# Patient Record
Sex: Male | Born: 1965 | Race: White | Hispanic: No | Smoking: Current every day smoker
Health system: Southern US, Community
[De-identification: ages and names within clinical notes are randomized; demographics above are authoritative.]

## PROBLEM LIST (undated history)

## (undated) DIAGNOSIS — C439 Malignant melanoma of skin, unspecified: Secondary | ICD-10-CM

## (undated) DIAGNOSIS — K589 Irritable bowel syndrome without diarrhea: Secondary | ICD-10-CM

## (undated) DIAGNOSIS — D649 Anemia, unspecified: Secondary | ICD-10-CM

## (undated) DIAGNOSIS — K219 Gastro-esophageal reflux disease without esophagitis: Secondary | ICD-10-CM

## (undated) DIAGNOSIS — F419 Anxiety disorder, unspecified: Secondary | ICD-10-CM

## (undated) DIAGNOSIS — E785 Hyperlipidemia, unspecified: Secondary | ICD-10-CM

## (undated) HISTORY — DX: Anemia, unspecified: D64.9

## (undated) HISTORY — PX: FRACTURE SURGERY: SHX138

## (undated) HISTORY — DX: Gastro-esophageal reflux disease without esophagitis: K21.9

## (undated) HISTORY — DX: Anxiety disorder, unspecified: F41.9

## (undated) HISTORY — DX: Hyperlipidemia, unspecified: E78.5

## (undated) HISTORY — DX: Irritable bowel syndrome, unspecified: K58.9

## (undated) HISTORY — PX: HERNIA REPAIR: SHX51

## (undated) HISTORY — DX: Malignant melanoma of skin, unspecified: C43.9

---

## 2004-02-24 ENCOUNTER — Ambulatory Visit: Payer: Self-pay | Admitting: Internal Medicine

## 2004-11-25 ENCOUNTER — Ambulatory Visit: Payer: Self-pay | Admitting: Internal Medicine

## 2005-03-04 ENCOUNTER — Ambulatory Visit: Payer: Self-pay | Admitting: Internal Medicine

## 2005-03-31 ENCOUNTER — Ambulatory Visit: Payer: Self-pay | Admitting: Internal Medicine

## 2005-05-11 ENCOUNTER — Ambulatory Visit: Payer: Self-pay | Admitting: Internal Medicine

## 2005-05-16 ENCOUNTER — Emergency Department (HOSPITAL_COMMUNITY): Admission: EM | Admit: 2005-05-16 | Discharge: 2005-05-16 | Payer: Self-pay | Admitting: Emergency Medicine

## 2005-08-08 ENCOUNTER — Ambulatory Visit: Payer: Self-pay | Admitting: Internal Medicine

## 2005-09-12 ENCOUNTER — Ambulatory Visit: Payer: Self-pay | Admitting: Internal Medicine

## 2006-03-09 ENCOUNTER — Ambulatory Visit: Payer: Self-pay | Admitting: Internal Medicine

## 2006-05-02 ENCOUNTER — Ambulatory Visit: Payer: Self-pay | Admitting: Internal Medicine

## 2006-07-07 ENCOUNTER — Encounter: Admission: RE | Admit: 2006-07-07 | Discharge: 2006-07-07 | Payer: Self-pay | Admitting: Internal Medicine

## 2006-08-10 DIAGNOSIS — K219 Gastro-esophageal reflux disease without esophagitis: Secondary | ICD-10-CM

## 2006-08-13 ENCOUNTER — Emergency Department (HOSPITAL_COMMUNITY): Admission: EM | Admit: 2006-08-13 | Discharge: 2006-08-14 | Payer: Self-pay | Admitting: Emergency Medicine

## 2006-08-17 ENCOUNTER — Ambulatory Visit: Payer: Self-pay | Admitting: Internal Medicine

## 2006-08-17 DIAGNOSIS — F1010 Alcohol abuse, uncomplicated: Secondary | ICD-10-CM

## 2006-08-21 ENCOUNTER — Ambulatory Visit: Payer: Self-pay | Admitting: Internal Medicine

## 2006-08-31 ENCOUNTER — Telehealth: Payer: Self-pay | Admitting: *Deleted

## 2006-09-27 ENCOUNTER — Telehealth: Payer: Self-pay | Admitting: Internal Medicine

## 2006-09-29 ENCOUNTER — Telehealth: Payer: Self-pay | Admitting: Internal Medicine

## 2006-10-05 ENCOUNTER — Telehealth: Payer: Self-pay | Admitting: Internal Medicine

## 2008-01-15 ENCOUNTER — Ambulatory Visit: Payer: Self-pay | Admitting: Internal Medicine

## 2008-01-15 DIAGNOSIS — F4322 Adjustment disorder with anxiety: Secondary | ICD-10-CM
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
## 2008-10-08 ENCOUNTER — Telehealth: Payer: Self-pay | Admitting: Internal Medicine

## 2010-11-08 LAB — I-STAT 8, (EC8 V) (CONVERTED LAB)
Bicarbonate: 22.1
Glucose, Bld: 96
HCT: 41
Hemoglobin: 13.9
pCO2, Ven: 40.1 — ABNORMAL LOW
pH, Ven: 7.35 — ABNORMAL HIGH

## 2010-11-08 LAB — POCT I-STAT CREATININE
Creatinine, Ser: 1
Operator id: 277751

## 2012-01-09 ENCOUNTER — Other Ambulatory Visit: Payer: Self-pay | Admitting: Family Medicine

## 2012-01-09 DIAGNOSIS — R1011 Right upper quadrant pain: Secondary | ICD-10-CM

## 2012-01-11 ENCOUNTER — Ambulatory Visit (HOSPITAL_COMMUNITY)
Admission: RE | Admit: 2012-01-11 | Discharge: 2012-01-11 | Disposition: A | Discharge status: Home / Self Care | Payer: No Typology Code available for payment source | Source: Ambulatory Visit | Attending: Family Medicine | Admitting: Family Medicine

## 2012-01-11 DIAGNOSIS — R1011 Right upper quadrant pain: Secondary | ICD-10-CM

## 2012-04-30 ENCOUNTER — Other Ambulatory Visit: Payer: Self-pay | Admitting: Physician Assistant

## 2012-05-28 ENCOUNTER — Other Ambulatory Visit: Payer: Self-pay | Admitting: Family Medicine

## 2012-05-28 NOTE — Telephone Encounter (Signed)
Patient last seen in office on 01-09-12. Notified at last refill that he NTBS. Please advise. Thank you

## 2012-07-06 ENCOUNTER — Encounter: Payer: Self-pay | Admitting: Family Medicine

## 2012-07-06 ENCOUNTER — Ambulatory Visit: Payer: Self-pay | Admitting: General Practice

## 2012-07-06 ENCOUNTER — Ambulatory Visit (INDEPENDENT_AMBULATORY_CARE_PROVIDER_SITE_OTHER): Payer: PRIVATE HEALTH INSURANCE | Admitting: Family Medicine

## 2012-07-06 VITALS — BP 123/80 | HR 94 | Temp 97.7°F | Wt 201.6 lb

## 2012-07-06 DIAGNOSIS — E785 Hyperlipidemia, unspecified: Secondary | ICD-10-CM

## 2012-07-06 DIAGNOSIS — K219 Gastro-esophageal reflux disease without esophagitis: Secondary | ICD-10-CM

## 2012-07-06 DIAGNOSIS — F39 Unspecified mood [affective] disorder: Secondary | ICD-10-CM

## 2012-07-06 LAB — POCT GLYCOSYLATED HEMOGLOBIN (HGB A1C): Hemoglobin A1C: 5.5

## 2012-07-06 MED ORDER — PANTOPRAZOLE SODIUM 40 MG PO TBEC: 40.0000 mg | DELAYED_RELEASE_TABLET | Freq: Every day | ORAL | Status: DC

## 2012-07-06 MED ORDER — LORATADINE 10 MG PO TABS: 10.0000 mg | ORAL_TABLET | Freq: Every day | ORAL | Status: DC

## 2012-07-06 MED ORDER — PRAVASTATIN SODIUM 40 MG PO TABS: 40.0000 mg | ORAL_TABLET | Freq: Every day | ORAL | Status: DC

## 2012-07-06 MED ORDER — PAROXETINE HCL 40 MG PO TABS: 40.0000 mg | ORAL_TABLET | ORAL | Status: DC

## 2012-07-06 NOTE — Progress Notes (Signed)
Patient ID: Austin Gonzales, male   DOB: 02/01/1965, 47 y.o.   MRN: [id number]  HPI:  Pt presents today for routine visit.  No acute issues or complaints today Mood has been stable with paxil. No HI/SI Denies ETOH. Only intermittent use.  GERD well controlled with protonix.   Patient Active Problem List   Diagnosis Date Noted  . ADJUSTMENT DISORDER WITH ANXIETY 01/15/2008  . 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 GERD 08/10/2006   Past Medical History: No past medical history on file.  Past Surgical History: No past surgical history on file.  Social History: History   Social History  . Marital Status: Married    Spouse Name: N/A    Number of Children: N/A  . Years of Education: N/A   Social History Main Topics  . Smoking status: Current Every Day Smoker -- 1.00 packs/day    Types: Cigarettes  . Smokeless tobacco: None  . Alcohol Use: None  . Drug Use: None  . Sexually Active: None   Other Topics Concern  . None   Social History Narrative  . None    Family History: No family history on file.  Allergies: No Known Allergies  Current Outpatient Prescriptions  Medication Sig Dispense Refill  . ALPRAZolam (XANAX) 0.5 MG tablet Take 0.5 mg by mouth 3 (three) times daily as needed for sleep.      Marland Kitchen loratadine (CLARITIN) 10 MG tablet Take 1 tablet (10 mg total) by mouth daily.  90 tablet  3  . pantoprazole (PROTONIX) 40 MG tablet Take 1 tablet (40 mg total) by mouth daily.  90 tablet  3  . PARoxetine (PAXIL) 40 MG tablet Take 1 tablet (40 mg total) by mouth every morning.  30 tablet  0  . pravastatin (PRAVACHOL) 40 MG tablet Take 1 tablet (40 mg total) by mouth daily.  90 tablet  3   No current facility-administered medications for this visit.   Review Of Systems: 12 point ROS negative except as noted above in HPI  Physical Exam: Filed Vitals:   07/06/12 1619  BP: 123/80  Pulse: 94  Temp: 97.7 F (36.5 C)   General: alert and cooperative HEENT: PERRLA  and extra ocular movement intact Heart: S1, S2 normal, no murmur, rub or gallop, regular rate and rhythm Lungs: clear to auscultation, no wheezes or rales and unlabored breathing Abdomen: abdomen is soft without significant tenderness, masses, organomegaly or guarding Extremities: extremities normal, atraumatic, no cyanosis or edema Skin:no rashes Neurology: normal without focal findings  Labs and Imaging: Lab Results  Component Value Date/Time   NA 137 08/13/2006 10:28 PM   K 3.4* 08/13/2006 10:28 PM   CL 104 08/13/2006 10:28 PM   BUN 6 08/13/2006 10:28 PM   CREATININE 1.0 08/13/2006 10:28 PM   GLUCOSE 96 08/13/2006 10:28 PM   Lab Results  Component Value Date   HGB 13.9 08/13/2006   HCT 41.0 08/13/2006      Assessment and Plan:  Will check baseline blood work including CMET and A1C Meds refilled today Discussed general care.  Follow up as needed.

## 2012-07-07 LAB — COMPREHENSIVE METABOLIC PANEL
AST: 22 U/L (ref 0–37)
BUN: 11 mg/dL (ref 6–23)
Chloride: 104 mEq/L (ref 96–112)
Creat: 0.98 mg/dL (ref 0.50–1.35)
Glucose, Bld: 97 mg/dL (ref 70–99)
Potassium: 3.8 mEq/L (ref 3.5–5.3)
Sodium: 135 mEq/L (ref 135–145)
Total Bilirubin: 0.4 mg/dL (ref 0.3–1.2)
Total Protein: 7.6 g/dL (ref 6.0–8.3)

## 2012-07-09 ENCOUNTER — Telehealth (HOSPITAL_COMMUNITY): Payer: Self-pay | Admitting: Dietician

## 2012-07-09 ENCOUNTER — Other Ambulatory Visit: Payer: Self-pay | Admitting: Family Medicine

## 2012-07-09 NOTE — Telephone Encounter (Signed)
Here on Friday to see [facility], all rxs got filled except Xanax, he needs, mom just died 2 weeks ago. Call into [facility]

## 2012-07-09 NOTE — Telephone Encounter (Addendum)
Called pt at [phone]. He reports that he works 7 days a week (6 AM-4:30 PM) at a company in [place] and scheduling appointments is very difficult for him, due to his work hours and commute. Declines scheduling appointment at this time. He is agreeable to printed information about a heart healthy diet. Provided RD contact information and pt reports he will call if he has questions.

## 2012-07-09 NOTE — Telephone Encounter (Signed)
Received referral from [facility] for dx: hyperlipidemia.

## 2012-07-10 MED ORDER — ALPRAZOLAM 0.5 MG PO TABS: 0.5000 mg | ORAL_TABLET | Freq: Three times a day (TID) | ORAL | Status: DC | PRN

## 2012-07-10 NOTE — Telephone Encounter (Signed)
Please call in rx for xanax with 1 refill 

## 2012-08-06 ENCOUNTER — Other Ambulatory Visit: Payer: Self-pay | Admitting: *Deleted

## 2012-08-06 DIAGNOSIS — F39 Unspecified mood [affective] disorder: Secondary | ICD-10-CM

## 2012-08-06 MED ORDER — PAROXETINE HCL 40 MG PO TABS: 40.0000 mg | ORAL_TABLET | ORAL | Status: DC

## 2012-09-10 ENCOUNTER — Other Ambulatory Visit: Payer: Self-pay | Admitting: Nurse Practitioner

## 2012-09-11 NOTE — Telephone Encounter (Signed)
Last seen 07/06/12  Dr Alvester Morin  Last filled 07/09/12 1 RF   If approved route to nurse to call in

## 2012-09-13 NOTE — Telephone Encounter (Addendum)
[facility] notfied pt needs an appt Pt aware

## 2012-09-13 NOTE — Telephone Encounter (Signed)
Needs an appointment for follow up , because he last saw Dr Alvester Morin and was doing well with the paxil and now he still needs to use xanax.

## 2012-09-17 ENCOUNTER — Ambulatory Visit (INDEPENDENT_AMBULATORY_CARE_PROVIDER_SITE_OTHER): Payer: PRIVATE HEALTH INSURANCE | Admitting: Family Medicine

## 2012-09-17 VITALS — BP 146/83 | HR 103 | Temp 98.9°F | Ht 68.0 in | Wt 202.6 lb

## 2012-09-17 DIAGNOSIS — J329 Chronic sinusitis, unspecified: Secondary | ICD-10-CM

## 2012-09-17 DIAGNOSIS — F39 Unspecified mood [affective] disorder: Secondary | ICD-10-CM

## 2012-09-17 MED ORDER — PAROXETINE HCL 40 MG PO TABS: 40.0000 mg | ORAL_TABLET | ORAL | Status: DC

## 2012-09-17 MED ORDER — ALPRAZOLAM 0.5 MG PO TABS: 0.5000 mg | ORAL_TABLET | Freq: Three times a day (TID) | ORAL | Status: DC | PRN

## 2012-09-17 MED ORDER — LEVOFLOXACIN 500 MG PO TABS: 500.0000 mg | ORAL_TABLET | Freq: Every day | ORAL | Status: DC

## 2012-09-17 MED ORDER — METHYLPREDNISOLONE (PAK) 4 MG PO TABS: ORAL_TABLET | ORAL | Status: DC

## 2012-09-17 NOTE — Progress Notes (Signed)
  Subjective:    Patient ID: THAER MIYOSHI, male    DOB: 1965-02-02, 47 y.o.   MRN: [id number]  HPI This 47 y.o. male presents for evaluation of sinus drainage and congestion. He  Has been having some mucopurulent sinus drainage.   Review of Systems C/o sinus congestion and sinus drainage. No chest pain, SOB, HA, dizziness, vision change, N/V, diarrhea, constipation, dysuria, urinary urgency or frequency, myalgias, arthralgias or rash.     Objective:   Physical Exam Vital signs noted  Well developed well nourished male.  HEENT - Head atraumatic Normocephalic                Eyes - PERRLA, Conjuctiva - clear Sclera- Clear EOMI                Ears - EAC's Wnl TM's Wnl Gross Hearing WNL                Nose - Nares patent                 Throat - oropharanx wnl Respiratory - Lungs CTA bilateral Cardiac - RRR S1 and S2 without murmur GI - Abdomen soft Nontender and bowel sounds active x 4 Extremities - No edema. Neuro - Grossly intact.       Assessment & Plan:  Unspecified episodic mood disorder - Plan: PARoxetine (PAXIL) 40 MG tablet po qd and refill xanax 0.5mg .  Sinusitis - Levaquin 500mg  one po qd x 10 days, medrol dose pack as directed, and push po fluids, rest, tylenol and motrin otc prn.

## 2012-09-17 NOTE — Patient Instructions (Addendum)

## 2012-11-09 ENCOUNTER — Encounter (INDEPENDENT_AMBULATORY_CARE_PROVIDER_SITE_OTHER): Payer: Self-pay

## 2012-11-09 ENCOUNTER — Encounter: Payer: Self-pay | Admitting: Family Medicine

## 2012-11-09 ENCOUNTER — Ambulatory Visit (INDEPENDENT_AMBULATORY_CARE_PROVIDER_SITE_OTHER): Payer: PRIVATE HEALTH INSURANCE | Admitting: Family Medicine

## 2012-11-09 ENCOUNTER — Telehealth: Payer: Self-pay | Admitting: Family Medicine

## 2012-11-09 VITALS — BP 125/77 | HR 97 | Temp 98.8°F | Ht 68.0 in | Wt 193.0 lb

## 2012-11-09 DIAGNOSIS — J069 Acute upper respiratory infection, unspecified: Secondary | ICD-10-CM

## 2012-11-09 DIAGNOSIS — R110 Nausea: Secondary | ICD-10-CM

## 2012-11-09 MED ORDER — CIPROFLOXACIN HCL 500 MG PO TABS: 500.0000 mg | ORAL_TABLET | Freq: Two times a day (BID) | ORAL | Status: DC

## 2012-11-09 MED ORDER — ONDANSETRON HCL 8 MG PO TABS: 8.0000 mg | ORAL_TABLET | Freq: Three times a day (TID) | ORAL | Status: DC | PRN

## 2012-11-09 NOTE — Patient Instructions (Signed)
Smoking Cessation Quitting smoking is important to your health and has many advantages. However, it is not always easy to quit since nicotine is a very addictive drug. Often times, people try 3 times or more before being able to quit. This document explains the best ways for you to prepare to quit smoking. Quitting takes hard work and a lot of effort, but you can do it. ADVANTAGES OF QUITTING SMOKING  You will live longer, feel better, and live better.  Your body will feel the impact of quitting smoking almost immediately.  Within 20 minutes, blood pressure decreases. Your pulse returns to its normal level.  After 8 hours, carbon monoxide levels in the blood return to normal. Your oxygen level increases.  After 24 hours, the chance of having a heart attack starts to decrease. Your breath, hair, and body stop smelling like smoke.  After 48 hours, damaged nerve endings begin to recover. Your sense of taste and smell improve.  After 72 hours, the body is virtually free of nicotine. Your bronchial tubes relax and breathing becomes easier.  After 2 to 12 weeks, lungs can hold more air. Exercise becomes easier and circulation improves.  The risk of having a heart attack, stroke, cancer, or lung disease is greatly reduced.  After 1 year, the risk of coronary heart disease is cut in half.  After 5 years, the risk of stroke falls to the same as a nonsmoker.  After 10 years, the risk of lung cancer is cut in half and the risk of other cancers decreases significantly.  After 15 years, the risk of coronary heart disease drops, usually to the level of a nonsmoker.  If you are pregnant, quitting smoking will improve your chances of having a healthy baby.  The people you live with, especially any children, will be healthier.  You will have extra money to spend on things other than cigarettes. QUESTIONS TO THINK ABOUT BEFORE ATTEMPTING TO QUIT You may want to talk about your answers with your  caregiver.  Why do you want to quit?  If you tried to quit in the past, what helped and what did not?  What will be the most difficult situations for you after you quit? How will you plan to handle them?  Who can help you through the tough times? Your family? Friends? A caregiver?  What pleasures do you get from smoking? What ways can you still get pleasure if you quit? Here are some questions to ask your caregiver:  How can you help me to be successful at quitting?  What medicine do you think would be best for me and how should I take it?  What should I do if I need more help?  What is smoking withdrawal like? How can I get information on withdrawal? GET READY  Set a quit date.  Change your environment by getting rid of all cigarettes, ashtrays, matches, and lighters in your home, car, or work. Do not let people smoke in your home.  Review your past attempts to quit. Think about what worked and what did not. GET SUPPORT AND ENCOURAGEMENT You have a better chance of being successful if you have help. You can get support in many ways.  Tell your family, friends, and co-workers that you are going to quit and need their support. Ask them not to smoke around you.  Get individual, group, or telephone counseling and support. Programs are available at local hospitals and health centers. Call your local health department for   information about programs in your area.  Spiritual beliefs and practices may help some smokers quit.  Download a "quit meter" on your computer to keep track of quit statistics, such as how long you have gone without smoking, cigarettes not smoked, and money saved.  Get a self-help book about quitting smoking and staying off of tobacco. LEARN NEW SKILLS AND BEHAVIORS  Distract yourself from urges to smoke. Talk to someone, go for a walk, or occupy your time with a task.  Change your normal routine. Take a different route to work. Drink tea instead of coffee.  Eat breakfast in a different place.  Reduce your stress. Take a hot bath, exercise, or read a book.  Plan something enjoyable to do every day. Reward yourself for not smoking.  Explore interactive web-based programs that specialize in helping you quit. GET MEDICINE AND USE IT CORRECTLY Medicines can help you stop smoking and decrease the urge to smoke. Combining medicine with the above behavioral methods and support can greatly increase your chances of successfully quitting smoking.  Nicotine replacement therapy helps deliver nicotine to your body without the negative effects and risks of smoking. Nicotine replacement therapy includes nicotine gum, lozenges, inhalers, nasal sprays, and skin patches. Some may be available over-the-counter and others require a prescription.  Antidepressant medicine helps people abstain from smoking, but how this works is unknown. This medicine is available by prescription.  Nicotinic receptor partial agonist medicine simulates the effect of nicotine in your brain. This medicine is available by prescription. Ask your caregiver for advice about which medicines to use and how to use them based on your health history. Your caregiver will tell you what side effects to look out for if you choose to be on a medicine or therapy. Carefully read the information on the package. Do not use any other product containing nicotine while using a nicotine replacement product.  RELAPSE OR DIFFICULT SITUATIONS Most relapses occur within the first 3 months after quitting. Do not be discouraged if you start smoking again. Remember, most people try several times before finally quitting. You may have symptoms of withdrawal because your body is used to nicotine. You may crave cigarettes, be irritable, feel very hungry, cough often, get headaches, or have difficulty concentrating. The withdrawal symptoms are only temporary. They are strongest when you first quit, but they will go away within  10 14 days. To reduce the chances of relapse, try to:  Avoid drinking alcohol. Drinking lowers your chances of successfully quitting.  Reduce the amount of caffeine you consume. Once you quit smoking, the amount of caffeine in your body increases and can give you symptoms, such as a rapid heartbeat, sweating, and anxiety.  Avoid smokers because they can make you want to smoke.  Do not let weight gain distract you. Many smokers will gain weight when they quit, usually less than 10 pounds. Eat a healthy diet and stay active. You can always lose the weight gained after you quit.  Find ways to improve your mood other than smoking. FOR MORE INFORMATION  www.smokefree.gov  Document Released: 01/04/2001 Document Revised: 07/12/2011 Document Reviewed: 04/21/2011 [employer] Patient Information 2014 [employer].  

## 2012-11-09 NOTE — Progress Notes (Signed)
  Subjective:    Patient ID: Austin Gonzales, male    DOB: [birth year], 47 y.o.   MRN: [id number]  HPI This 47 y.o. male presents for evaluation of being sick after he ate 3 days ago. He  States he developed nausea vomiting after going to eat out and after he ate some chicken.   Review of Systems C/o URI sx's and N/V. No chest pain, SOB, HA, dizziness, vision change, diarrhea, constipation, dysuria, urinary urgency or frequency, myalgias, arthralgias or rash.     Objective:   Physical Exam Vital signs noted  Well developed well nourished male.  HEENT - Head atraumatic Normocephalic                Eyes - PERRLA, Conjuctiva - clear Sclera- Clear EOMI                Ears - EAC's Wnl TM's Wnl Gross Hearing WNL                Nose - Nares patent                 Throat - oropharanx wnl Respiratory - Lungs CTA bilateral Cardiac - RRR S1 and S2 without murmur GI - Abdomen soft Nontender and bowel sounds active x 4 Extremities - No edema. Neuro - Grossly intact.       Assessment & Plan:  Nausea alone - Plan: ondansetron (ZOFRAN) 8 MG tablet  URI (upper respiratory infection) - Plan: ciprofloxacin (CIPRO) 500 MG tablet  Push po fluids, rest, drink gatorade, and follow up prn  Deatra Canter FNP

## 2012-11-09 NOTE — Telephone Encounter (Signed)
appt made

## 2013-01-22 ENCOUNTER — Other Ambulatory Visit: Payer: Self-pay | Admitting: Family Medicine

## 2013-01-25 NOTE — Telephone Encounter (Signed)
Last seen 11/09/12  [facility] [place]  If approved route to nurse to call into [facility]

## 2013-01-31 NOTE — Telephone Encounter (Signed)
Called into [facility]

## 2013-02-10 ENCOUNTER — Other Ambulatory Visit: Payer: Self-pay | Admitting: Family Medicine

## 2013-03-14 ENCOUNTER — Other Ambulatory Visit: Payer: Self-pay | Admitting: Family Medicine

## 2013-03-15 NOTE — Telephone Encounter (Signed)
ROUTE TO POOL IF APPROVED. CALL INTO [facility]

## 2013-03-18 ENCOUNTER — Other Ambulatory Visit: Payer: Self-pay | Admitting: Family Medicine

## 2013-03-19 NOTE — Telephone Encounter (Signed)
Patient last seen in office on 11-09-12. Rx last filled on 12-19-12. Please advise. If approved please route to Pool B so nurse can phone in to pharmacy

## 2013-03-22 ENCOUNTER — Telehealth: Payer: Self-pay | Admitting: Family Medicine

## 2013-03-23 ENCOUNTER — Other Ambulatory Visit: Payer: Self-pay | Admitting: Family Medicine

## 2013-03-23 MED ORDER — ALPRAZOLAM 0.5 MG PO TABS: 0.5000 mg | ORAL_TABLET | Freq: Two times a day (BID) | ORAL | Status: DC | PRN

## 2013-03-23 NOTE — Telephone Encounter (Signed)
I will give him #[phone] until he is seen again

## 2013-03-25 NOTE — Telephone Encounter (Signed)
  CAlled in 

## 2013-04-10 ENCOUNTER — Ambulatory Visit (INDEPENDENT_AMBULATORY_CARE_PROVIDER_SITE_OTHER): Payer: PRIVATE HEALTH INSURANCE | Admitting: Family Medicine

## 2013-04-10 VITALS — BP 114/70 | HR 84 | Temp 99.1°F | Ht 68.0 in | Wt 195.2 lb

## 2013-04-10 DIAGNOSIS — F329 Major depressive disorder, single episode, unspecified: Secondary | ICD-10-CM

## 2013-04-10 DIAGNOSIS — K219 Gastro-esophageal reflux disease without esophagitis: Secondary | ICD-10-CM

## 2013-04-10 DIAGNOSIS — F3289 Other specified depressive episodes: Secondary | ICD-10-CM

## 2013-04-10 DIAGNOSIS — F32A Depression, unspecified: Secondary | ICD-10-CM

## 2013-04-10 DIAGNOSIS — R5383 Other fatigue: Secondary | ICD-10-CM

## 2013-04-10 DIAGNOSIS — Z139 Encounter for screening, unspecified: Secondary | ICD-10-CM

## 2013-04-10 DIAGNOSIS — R5381 Other malaise: Secondary | ICD-10-CM

## 2013-04-10 DIAGNOSIS — E785 Hyperlipidemia, unspecified: Secondary | ICD-10-CM

## 2013-04-10 MED ORDER — PRAVASTATIN SODIUM 40 MG PO TABS: 40.0000 mg | ORAL_TABLET | Freq: Every day | ORAL | Status: DC

## 2013-04-10 MED ORDER — PAROXETINE HCL 40 MG PO TABS
40.0000 mg | ORAL_TABLET | Freq: Every morning | ORAL | Status: DC
Start: 2013-04-10 — End: 2013-09-26

## 2013-04-10 MED ORDER — BUPROPION HCL 100 MG PO TABS: 100.0000 mg | ORAL_TABLET | Freq: Two times a day (BID) | ORAL | Status: DC

## 2013-04-10 MED ORDER — PANTOPRAZOLE SODIUM 40 MG PO TBEC: 40.0000 mg | DELAYED_RELEASE_TABLET | Freq: Every day | ORAL | Status: DC

## 2013-04-10 MED ORDER — ALPRAZOLAM 0.5 MG PO TABS: 0.5000 mg | ORAL_TABLET | Freq: Two times a day (BID) | ORAL | Status: DC | PRN
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
## 2013-04-10 NOTE — Addendum Note (Signed)
Addended by: Earlene Plater on: 04/10/2013 04:56 PM   Modules accepted: Orders

## 2013-04-19 ENCOUNTER — Ambulatory Visit (INDEPENDENT_AMBULATORY_CARE_PROVIDER_SITE_OTHER): Payer: PRIVATE HEALTH INSURANCE | Admitting: Family Medicine

## 2013-04-19 ENCOUNTER — Encounter: Payer: Self-pay | Admitting: Family Medicine

## 2013-04-19 VITALS — BP 122/77 | HR 77 | Temp 97.1°F | Ht 68.0 in | Wt 201.8 lb

## 2013-04-19 DIAGNOSIS — F329 Major depressive disorder, single episode, unspecified: Secondary | ICD-10-CM

## 2013-04-19 DIAGNOSIS — E785 Hyperlipidemia, unspecified: Secondary | ICD-10-CM

## 2013-04-19 DIAGNOSIS — F3289 Other specified depressive episodes: Secondary | ICD-10-CM

## 2013-04-19 DIAGNOSIS — R5383 Other fatigue: Secondary | ICD-10-CM

## 2013-04-19 DIAGNOSIS — R5381 Other malaise: Secondary | ICD-10-CM

## 2013-04-19 DIAGNOSIS — F32A Depression, unspecified: Secondary | ICD-10-CM

## 2013-04-19 DIAGNOSIS — Z139 Encounter for screening, unspecified: Secondary | ICD-10-CM

## 2013-04-19 LAB — POCT CBC
Granulocyte percent: 57.7 %G (ref 37–80)
HCT, POC: 36.8 % — AB (ref 43.5–53.7)
Hemoglobin: 12 g/dL — AB (ref 14.1–18.1)
Lymph, poc: 2.2 (ref 0.6–3.4)
MCH, POC: 31.5 pg — AB (ref 27–31.2)
MCHC: 32.7 g/dL (ref 31.8–35.4)
MCV: 96.5 fL (ref 80–97)
MPV: 7.2 fL (ref 0–99.8)
POC Granulocyte: 4 (ref 2–6.9)
POC LYMPH PERCENT: 31.5 %L (ref 10–50)
Platelet Count, POC: 312 10*3/uL (ref 142–424)
RBC: 3.8 M/uL — AB (ref 4.69–6.13)
RDW, POC: 12.8 %
WBC: 6.9 10*3/uL (ref 4.6–10.2)

## 2013-04-19 MED ORDER — BUPROPION HCL 100 MG PO TABS: ORAL_TABLET | ORAL | Status: DC

## 2013-04-19 NOTE — Progress Notes (Signed)
   Subjective:    Patient ID: Austin Gonzales, male    DOB: 10/21/1965, 48 y.o.   MRN: [id number]  HPI  This 48 y.o. male presents for evaluation of follow up on depression. He is having headache and he is taking wellbutrin $RemoveBeforeDEI'100mg'YzWQQnUoUFPXqrDc$  po bid and paxil $RemoveB'40mg'VSTtoPth$  po qd.  He is having some more energy.  His sx's were lack of energy.  Review of Systems C/o depression   No chest pain, SOB, HA, dizziness, vision change, N/V, diarrhea, constipation, dysuria, urinary urgency or frequency, myalgias, arthralgias or rash.  Objective:   Physical Exam  Vital signs noted  Well developed well nourished male.  HEENT - Head atraumatic Normocephalic                Eyes - PERRLA, Conjuctiva - clear Sclera- Clear EOMI                Ears - EAC's Wnl TM's Wnl Gross Hearing WNL                Throat - oropharanx wnl Respiratory - Lungs CTA bilateral Cardiac - RRR S1 and S2 without murmur GI - Abdomen soft Nontender and bowel sounds active x 4 Extremities - No edema. Neuro - Grossly intact.      Assessment & Plan:  Depression - Plan: buPROPion (WELLBUTRIN) 100 MG tablet  Fatigue - Plan: POCT CBC, Vit D  25 hydroxy (rtn osteoporosis monitoring), Thyroid Panel With TSH  HLD (hyperlipidemia) - Plan: CMP14+EGFR, Lipid panel  Screening - Plan: PSA, total and free  Follow up in 3 months  Lysbeth Penner FNP

## 2013-04-20 LAB — CMP14+EGFR
ALT: 12 IU/L (ref 0–44)
AST: 13 IU/L (ref 0–40)
Albumin/Globulin Ratio: 1.8 (ref 1.1–2.5)
Albumin: 4.4 g/dL (ref 3.5–5.5)
Alkaline Phosphatase: 76 IU/L (ref 39–117)
BUN/Creatinine Ratio: 11 (ref 9–20)
BUN: 11 mg/dL (ref 6–24)
CO2: 22 mmol/L (ref 18–29)
Calcium: 9.5 mg/dL (ref 8.7–10.2)
Chloride: 104 mmol/L (ref 97–108)
Creatinine, Ser: 0.96 mg/dL (ref 0.76–1.27)
GFR calc Af Amer: 107 mL/min/{1.73_m2} (ref >59)
GFR calc non Af Amer: 92 mL/min/{1.73_m2} (ref >59)
Globulin, Total: 2.4 g/dL (ref 1.5–4.5)
Glucose: 95 mg/dL (ref 65–99)
Potassium: 4.2 mmol/L (ref 3.5–5.2)
Sodium: 140 mmol/L (ref 134–144)
Total Bilirubin: <0.2 mg/dL (ref 0.0–1.2)
Total Protein: 6.8 g/dL (ref 6.0–8.5)

## 2013-04-20 LAB — VITAMIN D 25 HYDROXY (VIT D DEFICIENCY, FRACTURES): Vit D, 25-Hydroxy: 30.8 ng/mL (ref 30.0–100.0)

## 2013-04-20 LAB — LIPID PANEL
Chol/HDL Ratio: 4 ratio units (ref 0.0–5.0)
Cholesterol, Total: 158 mg/dL (ref 100–199)
HDL: 40 mg/dL (ref >39)
LDL Calculated: 97 mg/dL (ref 0–99)
Triglycerides: 105 mg/dL (ref 0–149)
VLDL Cholesterol Cal: 21 mg/dL (ref 5–40)

## 2013-04-20 LAB — PSA, TOTAL AND FREE
PSA, Free Pct: 18.2 %
PSA, Free: 0.2 ng/mL
PSA: 1.1 ng/mL (ref 0.0–4.0)

## 2013-04-20 LAB — THYROID PANEL WITH TSH
Free Thyroxine Index: 2 (ref 1.2–4.9)
T3 Uptake Ratio: 29 % (ref 24–39)
T4, Total: 6.9 ug/dL (ref 4.5–12.0)
TSH: 1.26 u[IU]/mL (ref 0.450–4.500)

## 2013-04-25 ENCOUNTER — Other Ambulatory Visit: Payer: Self-pay | Admitting: *Deleted

## 2013-04-25 DIAGNOSIS — D649 Anemia, unspecified: Secondary | ICD-10-CM

## 2013-05-01 ENCOUNTER — Ambulatory Visit: Payer: PRIVATE HEALTH INSURANCE | Admitting: Family Medicine

## 2013-05-02 ENCOUNTER — Ambulatory Visit (INDEPENDENT_AMBULATORY_CARE_PROVIDER_SITE_OTHER): Payer: PRIVATE HEALTH INSURANCE

## 2013-05-02 ENCOUNTER — Encounter: Payer: Self-pay | Admitting: Family Medicine

## 2013-05-02 ENCOUNTER — Ambulatory Visit (INDEPENDENT_AMBULATORY_CARE_PROVIDER_SITE_OTHER): Payer: PRIVATE HEALTH INSURANCE | Admitting: Family Medicine

## 2013-05-02 VITALS — BP 114/74 | HR 82 | Temp 99.2°F | Ht 68.0 in | Wt 195.0 lb

## 2013-05-02 DIAGNOSIS — R05 Cough: Secondary | ICD-10-CM

## 2013-05-02 DIAGNOSIS — R059 Cough, unspecified: Secondary | ICD-10-CM

## 2013-05-02 DIAGNOSIS — R5383 Other fatigue: Secondary | ICD-10-CM

## 2013-05-02 DIAGNOSIS — F17200 Nicotine dependence, unspecified, uncomplicated: Secondary | ICD-10-CM

## 2013-05-02 DIAGNOSIS — R5381 Other malaise: Secondary | ICD-10-CM

## 2013-05-02 DIAGNOSIS — Z720 Tobacco use: Secondary | ICD-10-CM

## 2013-05-02 DIAGNOSIS — D649 Anemia, unspecified: Secondary | ICD-10-CM

## 2013-05-02 DIAGNOSIS — Z125 Encounter for screening for malignant neoplasm of prostate: Secondary | ICD-10-CM

## 2013-05-02 LAB — POCT CBC
Granulocyte percent: 71.1 %G (ref 37–80)
HCT, POC: 44.8 % (ref 43.5–53.7)
Hemoglobin: 14.3 g/dL (ref 14.1–18.1)
Lymph, poc: 2.5 (ref 0.6–3.4)
MCH, POC: 31.3 pg — AB (ref 27–31.2)
MCHC: 31.9 g/dL (ref 31.8–35.4)
MCV: 98 fL — AB (ref 80–97)
MPV: 8 fL (ref 0–99.8)
POC Granulocyte: 6.7 (ref 2–6.9)
POC LYMPH PERCENT: 26.2 %L (ref 10–50)
Platelet Count, POC: 311 10*3/uL (ref 142–424)
RBC: 4.6 M/uL — AB (ref 4.69–6.13)
RDW, POC: 12.6 %
WBC: 9.4 10*3/uL (ref 4.6–10.2)

## 2013-05-02 MED ORDER — FERROUS SULFATE 325 (65 FE) MG PO TBEC: 325.0000 mg | DELAYED_RELEASE_TABLET | Freq: Every day | ORAL | Status: DC

## 2013-05-02 NOTE — Progress Notes (Signed)
   Subjective:    Patient ID: Austin Gonzales, male    DOB: 1965/07/19, 48 y.o.   MRN: [id number]  HPI This 48 y.o. male presents for evaluation of follow up on recent labs.  He has anemia and he denies abdominal pain, rectal bleeding, or black tarry stools.  He has fatigue. He has hx of depression.  He has hx of tobacco abuse.  He has hx of IBS. He has been having diarrhea and 4-5 bm's that are loose. He had colonoscopy around 10 years ago he states because of IBS and denies hx of anemia. He is no longer having headaches since cutting back on the wellbutrin.   Review of Systems C/o anemia and fatigue   No chest pain, SOB, HA, dizziness, vision change, N/V, diarrhea, constipation, dysuria, urinary urgency or frequency, myalgias, arthralgias or rash.  Objective:   Physical Exam  Vital signs noted  Well developed well nourished male.  HEENT - Head atraumatic Normocephalic                Eyes - PERRLA, Conjuctiva - clear Sclera- Clear EOMI                Ears - EAC's Wnl TM's Wnl Gross Hearing WNL                Nose - Nares patent                 Throat - oropharanx wnl Respiratory - Lungs with exp wheezes on right Cardiac - RRR S1 and S2 without murmur GI - Abdomen soft Nontender and bowel sounds active x 4 Rectal - Prostate normal and hemocult neg Extremities - No edema. Neuro - Grossly intact.     cxr - no infiltrates Prelimnary reading by Iverson Alamin  Assessment & Plan:  Anemia - Plan: POCT CBC, ferrous sulfate 325 (65 FE) MG EC tablet, Ambulatory referral to Gastroenterology, Anemia Profile B, CANCELED: Anemia panel  Special screening for malignant neoplasm of prostate - Plan: Fecal occult blood, imunochemical, Ambulatory referral to Gastroenterology  Other malaise and fatigue - Plan: ferrous sulfate 325 (65 FE) MG EC tablet, Ambulatory referral to Gastroenterology, Vitamin B12, CANCELED: Anemia panel  Tobacco abuse - Plan: DG Chest 2 View and discussed DC  smoking  Cough - Plan: DG Chest 2 View  Lysbeth Penner FNP

## 2013-05-03 LAB — ANEMIA PROFILE B
Basophils Absolute: 0.1 10*3/uL (ref 0.0–0.2)
Basos: 1 %
Eos: 2 %
Eosinophils Absolute: 0.2 10*3/uL (ref 0.0–0.4)
Ferritin: 149 ng/mL (ref 30–400)
Folate: >19.9 ng/mL (ref >3.0)
HCT: 41.5 % (ref 37.5–51.0)
Hemoglobin: 14.4 g/dL (ref 12.6–17.7)
Immature Grans (Abs): 0 10*3/uL (ref 0.0–0.1)
Immature Granulocytes: 0 %
Iron Saturation: 34 % (ref 15–55)
Iron: 112 ug/dL (ref 40–155)
Lymphocytes Absolute: 2.6 10*3/uL (ref 0.7–3.1)
Lymphs: 27 %
MCH: 33 pg (ref 26.6–33.0)
MCHC: 34.7 g/dL (ref 31.5–35.7)
MCV: 95 fL (ref 79–97)
Monocytes Absolute: 0.9 10*3/uL (ref 0.1–0.9)
Monocytes: 10 %
Neutrophils Absolute: 5.7 10*3/uL (ref 1.4–7.0)
Neutrophils Relative %: 60 %
Platelets: 311 10*3/uL (ref 150–379)
RBC: 4.37 x10E6/uL (ref 4.14–5.80)
RDW: 13.7 % (ref 12.3–15.4)
Retic Ct Pct: 1.2 % (ref 0.6–2.6)
TIBC: 326 ug/dL (ref 250–450)
UIBC: 214 ug/dL (ref 150–375)
Vitamin B-12: >1999 pg/mL — ABNORMAL HIGH (ref 211–946)
WBC: 9.5 10*3/uL (ref 3.4–10.8)

## 2013-05-04 LAB — FECAL OCCULT BLOOD, IMMUNOCHEMICAL: Fecal Occult Bld: NEGATIVE

## 2013-05-20 ENCOUNTER — Encounter: Payer: Self-pay | Admitting: Family Medicine

## 2013-05-20 ENCOUNTER — Encounter (INDEPENDENT_AMBULATORY_CARE_PROVIDER_SITE_OTHER): Payer: Self-pay | Admitting: *Deleted

## 2013-05-20 ENCOUNTER — Ambulatory Visit (INDEPENDENT_AMBULATORY_CARE_PROVIDER_SITE_OTHER): Payer: PRIVATE HEALTH INSURANCE | Admitting: Family Medicine

## 2013-05-20 VITALS — BP 122/77 | HR 97 | Temp 97.9°F | Ht 68.0 in | Wt 194.6 lb

## 2013-05-20 DIAGNOSIS — K649 Unspecified hemorrhoids: Secondary | ICD-10-CM

## 2013-05-20 MED ORDER — HYDROCORTISONE ACETATE 25 MG RE SUPP: 25.0000 mg | Freq: Two times a day (BID) | RECTAL | Status: DC

## 2013-05-20 NOTE — Progress Notes (Signed)
   Subjective:    Patient ID: JEMMIE RHINEHART, male    DOB: [birth year], 48 y.o.   MRN: [id number]  HPI This 48 y.o. male presents for evaluation of hemorrhoids.  He has been having more discomfort and has tried to use otc meds and is not getting better.   Review of Systems No chest pain, SOB, HA, dizziness, vision change, N/V, diarrhea, constipation, dysuria, urinary urgency or frequency, myalgias, arthralgias or rash.     Objective:   Physical Exam  Vital signs noted  Well developed well nourished male.  HEENT - Head atraumatic Normocephalic                Eyes - PERRLA, Conjuctiva - clear Sclera- Clear EOMI                Ears - EAC's Wnl TM's Wnl Gross Hearing WNL                Throat - oropharanx wnl Respiratory - Lungs CTA bilateral Cardiac - RRR S1 and S2 without murmur GI - Abdomen soft Nontender and bowel sounds active x 4       Assessment & Plan:  Hemorrhoids - Plan: hydrocortisone (ANUSOL-HC) 25 MG suppository  Lysbeth Penner FNP

## 2013-05-28 ENCOUNTER — Ambulatory Visit (INDEPENDENT_AMBULATORY_CARE_PROVIDER_SITE_OTHER): Payer: No Typology Code available for payment source | Admitting: Internal Medicine

## 2013-05-28 ENCOUNTER — Encounter (INDEPENDENT_AMBULATORY_CARE_PROVIDER_SITE_OTHER): Payer: Self-pay | Admitting: *Deleted

## 2013-05-28 ENCOUNTER — Other Ambulatory Visit (INDEPENDENT_AMBULATORY_CARE_PROVIDER_SITE_OTHER): Payer: Self-pay | Admitting: *Deleted

## 2013-05-28 ENCOUNTER — Telehealth (INDEPENDENT_AMBULATORY_CARE_PROVIDER_SITE_OTHER): Payer: Self-pay | Admitting: *Deleted

## 2013-05-28 ENCOUNTER — Encounter (INDEPENDENT_AMBULATORY_CARE_PROVIDER_SITE_OTHER): Payer: Self-pay | Admitting: Internal Medicine

## 2013-05-28 VITALS — BP 112/70 | HR 84 | Temp 98.3°F | Ht 70.0 in | Wt 192.3 lb

## 2013-05-28 DIAGNOSIS — R195 Other fecal abnormalities: Secondary | ICD-10-CM

## 2013-05-28 DIAGNOSIS — Z1211 Encounter for screening for malignant neoplasm of colon: Secondary | ICD-10-CM

## 2013-05-28 DIAGNOSIS — K649 Unspecified hemorrhoids: Secondary | ICD-10-CM

## 2013-05-28 NOTE — Patient Instructions (Signed)
Colonoscopy with Dr. Rehman. The risks and benefits such as perforation, bleeding, and infection were reviewed with the patient and is agreeable. 

## 2013-05-28 NOTE — Progress Notes (Signed)
Subjective:     Patient ID: Austin Gonzales, male   DOB: 1965/04/18, 48 y.o.   MRN: [id number]  HPI Ref[facility] to our office [facility] for anemia.  He tells me he has external hemorrhoids and has been losing blood.  Presently taking iron. Started on Iron a couple of weeks.   He has a BM x 6 days on average. Some days he has more stools.  Stools are always loose. In the past, his stools have always been loose.  The change in his stools is they are more frequent.  He says his symptoms have worsened in the past 4 months. He has seen blood in the commode.  Appetite is good for the most part. He has not lost any weight. He also c/o abdominal pain which has worsened in the last couple of months.  He says he can hear rolling in his abdomen. Acid reflux controlled for the most part with Protonix.    Dr. Fuller Plan Colonoscopy over 20 yrs ago and was normal.     CBC    Component Value Date/Time   WBC 9.4 05/02/2013 0928   WBC 9.5 05/02/2013 0907   RBC 4.6* 05/02/2013 0928   RBC 4.37 05/02/2013 0907   HGB 14.3 05/02/2013 0928   HGB 14.4 05/02/2013 0907   HCT 44.8 05/02/2013 0928   HCT 41.5 05/02/2013 0907   PLT 311 05/02/2013 0907   MCV 98.0* 05/02/2013 0928   MCV 95 05/02/2013 0907   MCH 31.3* 05/02/2013 0928   MCH 33.0 05/02/2013 0907   MCHC 31.9 05/02/2013 0928   MCHC 34.7 05/02/2013 0907   RDW 13.7 05/02/2013 0907   LYMPHSABS 2.6 05/02/2013 0907   EOSABS 0.2 05/02/2013 0907   BASOSABS 0.1 05/02/2013 0907  05/02/2013 :TIBC 326, UIBC 215, Iron 112, Iron Sat 34, Ferritin 149, Vitamin B12 greater than 1999 Occult blood negative.   3/27/205 H and H 12.0 and 36.8.   Review of Systems Married. Two children in good health. 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    Objective:   Physical Exam  Filed Vitals:   05/28/13 1508  BP: 112/70  Pulse: 84  Temp: 98.3 F (36.8 C)  Height: 5\' 10"  (1.778 m)  Weight: 192 lb 4.8 oz (87.227 kg)   Alert and oriented. Skin warm and dry. Oral mucosa is moist.   .  Sclera anicteric, conjunctivae is pink. Thyroid not enlarged. No cervical lymphadenopathy. Lungs clear. Heart regular rate and rhythm.  Abdomen is soft. Bowel sounds are positive. No hepatomegaly. No abdominal masses felt. No tenderness.  No edema to lower extremities. . External hemorrhoids noted which was very tender to the touch.      Assessment:     Change in stool. Colonic neoplasm needs to be ruled out.     Plan:    Colonoscopy with Dr. Laural Golden. The risks and benefits such as perforation, bleeding, and infection were reviewed with the patient and is agreeable.

## 2013-05-28 NOTE — Telephone Encounter (Signed)
Patient needs movi prep 

## 2013-05-29 DIAGNOSIS — R195 Other fecal abnormalities: Secondary | ICD-10-CM | POA: Insufficient documentation

## 2013-05-29 MED ORDER — PEG-KCL-NACL-NASULF-NA ASC-C 100 G PO SOLR: 1.0000 | Freq: Once | ORAL | Status: DC

## 2013-05-31 ENCOUNTER — Telehealth (INDEPENDENT_AMBULATORY_CARE_PROVIDER_SITE_OTHER): Payer: Self-pay | Admitting: *Deleted

## 2013-05-31 NOTE — Telephone Encounter (Signed)
TCS auth# [id number] per Dennis Bast, PN Utilization Review Coordinator

## 2013-06-11 ENCOUNTER — Encounter: Payer: Self-pay | Admitting: Family Medicine

## 2013-06-11 ENCOUNTER — Encounter (HOSPITAL_COMMUNITY): Payer: Self-pay | Admitting: Pharmacy Technician

## 2013-06-11 ENCOUNTER — Ambulatory Visit (INDEPENDENT_AMBULATORY_CARE_PROVIDER_SITE_OTHER): Payer: PRIVATE HEALTH INSURANCE | Admitting: Family Medicine

## 2013-06-11 VITALS — BP 128/94 | HR 109 | Temp 99.0°F | Ht 70.0 in | Wt 183.6 lb

## 2013-06-11 DIAGNOSIS — F32A Depression, unspecified: Secondary | ICD-10-CM

## 2013-06-11 DIAGNOSIS — T148 Other injury of unspecified body region: Secondary | ICD-10-CM

## 2013-06-11 DIAGNOSIS — F329 Major depressive disorder, single episode, unspecified: Secondary | ICD-10-CM

## 2013-06-11 DIAGNOSIS — F411 Generalized anxiety disorder: Secondary | ICD-10-CM

## 2013-06-11 DIAGNOSIS — W57XXXA Bitten or stung by nonvenomous insect and other nonvenomous arthropods, initial encounter: Secondary | ICD-10-CM

## 2013-06-11 DIAGNOSIS — F3289 Other specified depressive episodes: Secondary | ICD-10-CM

## 2013-06-11 MED ORDER — ALPRAZOLAM 0.5 MG PO TABS
0.5000 mg | ORAL_TABLET | Freq: Two times a day (BID) | ORAL | Status: DC | PRN
Start: 2013-06-11 — End: 2013-07-02

## 2013-06-11 MED ORDER — PROMETHAZINE HCL 25 MG PO TABS: 25.0000 mg | ORAL_TABLET | Freq: Three times a day (TID) | ORAL | Status: DC | PRN

## 2013-06-11 MED ORDER — DOXYCYCLINE HYCLATE 100 MG PO TABS: 100.0000 mg | ORAL_TABLET | Freq: Two times a day (BID) | ORAL | Status: DC

## 2013-06-11 NOTE — Progress Notes (Signed)
   Subjective:    Patient ID: Austin Gonzales, male    DOB: 1965/04/10, 48 y.o.   MRN: [id number]  HPI This 48 y.o. male presents for evaluation of feeling sick and recently he has been bitten by a tick and is worried he may have tick borne illness.  He has been going through a divorce and is needing refill on his xanax.   Review of Systems C/o tick bite and anxiety No chest pain, SOB, HA, dizziness, vision change, N/V, diarrhea, constipation, dysuria, urinary urgency or frequency, myalgias, arthralgias or rash.     Objective:   Physical Exam Vital signs noted  Well developed well nourished male.  HEENT - Head atraumatic Normocephalic                Eyes - PERRLA, Conjuctiva - clear Sclera- Clear EOMI                Ears - EAC's Wnl TM's Wnl Gross Hearing WNL                Throat - oropharanx wnl Respiratory - Lungs CTA bilateral Cardiac - RRR S1 and S2 without murmur GI - Abdomen soft Nontender and bowel sounds active x 4 Skin - Rash over right back       Assessment & Plan:  Tick bite - Plan: doxycycline (VIBRA-TABS) 100 MG tablet, Lyme Ab/Western Blot Reflex, Rocky mtn spotted fvr abs pnl(IgG+IgM), promethazine (PHENERGAN) 25 MG tablet  Depression - Plan: ALPRAZolam (XANAX) 0.5 MG tablet  Anxiety state, unspecified - Plan: ALPRAZolam (XANAX) 0.5 MG tablet He states he is going through divorce and recommend he follow up prn  Lysbeth Penner FNP

## 2013-06-13 LAB — LYME AB/WESTERN BLOT REFLEX
LYME DISEASE AB, QUANT, IGM: <0.8 index (ref 0.00–0.79)
Lyme IgG/IgM Ab: <0.91 {ISR} (ref 0.00–0.90)

## 2013-06-13 LAB — ROCKY MTN SPOTTED FVR ABS PNL(IGG+IGM)
RMSF IgG: NEGATIVE
RMSF IgM: 0.47 index (ref 0.00–0.89)

## 2013-06-19 ENCOUNTER — Telehealth: Payer: Self-pay | Admitting: Family Medicine

## 2013-06-19 NOTE — Telephone Encounter (Signed)
error 

## 2013-06-26 ENCOUNTER — Encounter (HOSPITAL_COMMUNITY)
Admission: RE | Disposition: A | Discharge status: Home / Self Care | Payer: Self-pay | Source: Ambulatory Visit | Attending: Internal Medicine

## 2013-06-26 ENCOUNTER — Ambulatory Visit (HOSPITAL_COMMUNITY)
Admission: RE | Admit: 2013-06-26 | Discharge: 2013-06-26 | Disposition: A | Discharge status: Home / Self Care | Payer: No Typology Code available for payment source | Source: Ambulatory Visit | Attending: Internal Medicine | Admitting: Internal Medicine

## 2013-06-26 ENCOUNTER — Encounter (HOSPITAL_COMMUNITY): Payer: Self-pay

## 2013-06-26 DIAGNOSIS — K921 Melena: Secondary | ICD-10-CM | POA: Insufficient documentation

## 2013-06-26 DIAGNOSIS — K644 Residual hemorrhoidal skin tags: Secondary | ICD-10-CM | POA: Insufficient documentation

## 2013-06-26 DIAGNOSIS — D126 Benign neoplasm of colon, unspecified: Secondary | ICD-10-CM | POA: Insufficient documentation

## 2013-06-26 DIAGNOSIS — F411 Generalized anxiety disorder: Secondary | ICD-10-CM | POA: Insufficient documentation

## 2013-06-26 DIAGNOSIS — Z79899 Other long term (current) drug therapy: Secondary | ICD-10-CM | POA: Insufficient documentation

## 2013-06-26 DIAGNOSIS — F17200 Nicotine dependence, unspecified, uncomplicated: Secondary | ICD-10-CM | POA: Insufficient documentation

## 2013-06-26 DIAGNOSIS — D649 Anemia, unspecified: Secondary | ICD-10-CM | POA: Insufficient documentation

## 2013-06-26 DIAGNOSIS — E785 Hyperlipidemia, unspecified: Secondary | ICD-10-CM | POA: Insufficient documentation

## 2013-06-26 DIAGNOSIS — R197 Diarrhea, unspecified: Secondary | ICD-10-CM

## 2013-06-26 DIAGNOSIS — R195 Other fecal abnormalities: Secondary | ICD-10-CM

## 2013-06-26 DIAGNOSIS — K649 Unspecified hemorrhoids: Secondary | ICD-10-CM

## 2013-06-26 DIAGNOSIS — K219 Gastro-esophageal reflux disease without esophagitis: Secondary | ICD-10-CM | POA: Insufficient documentation

## 2013-06-26 HISTORY — PX: COLONOSCOPY: SHX5424

## 2013-06-26 SURGERY — COLONOSCOPY
Anesthesia: Moderate Sedation

## 2013-06-26 MED ORDER — MEPERIDINE HCL 50 MG/ML IJ SOLN
INTRAMUSCULAR | Status: AC
  Filled 2013-06-26: qty 1

## 2013-06-26 MED ORDER — MIDAZOLAM HCL 5 MG/5ML IJ SOLN
INTRAMUSCULAR | Status: DC | PRN
  Administered 2013-06-26 (×2): 2 mg via INTRAVENOUS
  Administered 2013-06-26: 1 mg via INTRAVENOUS
  Administered 2013-06-26: 2 mg via INTRAVENOUS
  Administered 2013-06-26: 3 mg via INTRAVENOUS
  Administered 2013-06-26: 2 mg via INTRAVENOUS
  Administered 2013-06-26: 3 mg via INTRAVENOUS

## 2013-06-26 MED ORDER — STERILE WATER FOR IRRIGATION IR SOLN
Status: DC | PRN
  Administered 2013-06-26: 12:00:00

## 2013-06-26 MED ORDER — SODIUM CHLORIDE 0.9 % IV SOLN
INTRAVENOUS | Status: DC
  Administered 2013-06-26: 11:00:00 via INTRAVENOUS

## 2013-06-26 MED ORDER — MIDAZOLAM HCL 5 MG/5ML IJ SOLN
INTRAMUSCULAR | Status: AC
  Filled 2013-06-26: qty 5

## 2013-06-26 MED ORDER — DICYCLOMINE HCL 10 MG PO CAPS: 10.0000 mg | ORAL_CAPSULE | Freq: Three times a day (TID) | ORAL | Status: DC

## 2013-06-26 MED ORDER — MIDAZOLAM HCL 5 MG/5ML IJ SOLN
INTRAMUSCULAR | Status: AC
  Filled 2013-06-26: qty 10

## 2013-06-26 MED ORDER — MEPERIDINE HCL 50 MG/ML IJ SOLN
INTRAMUSCULAR | Status: DC | PRN
  Administered 2013-06-26 (×2): 25 mg via INTRAVENOUS

## 2013-06-26 NOTE — Op Note (Addendum)
COLONOSCOPY PROCEDURE REPORT  PATIENT:  Austin Gonzales  MR#:  [id number] Birthdate:  Jul 21, 1965, 48 y.o., male Endoscopist:  Dr. Rogene Houston, MD Referred By:  Stevan Born FNP/Dr. Redge Gainer, MD r. No ref. provider found Procedure Date: 06/26/2013  Procedure:   Colonoscopy  Indications:  Patient is 48 year old Caucasian male who presents with chronic diarrhea and an intermittent hematochezia. Back in March 2015 he was noted to have hemoglobin of 12 g and iron studies were normal. B12 and folate levels were also normal. Recent hemoglobin was up to 14.3 g. He is undergoing diagnostic colonoscopy.  Informed Consent:  The procedure and risks were reviewed with the patient and informed consent was obtained.  Medications:  Demerol 50 mg IV Versed 15 mg IV  Description of procedure:  After a digital rectal exam was performed, that colonoscope was advanced from the anus through the rectum and colon to the area of the cecum, ileocecal valve and appendiceal orifice. The cecum was deeply intubated. These structures were well-seen and photographed for the record. From the level of the cecum and ileocecal valve, the scope was slowly and cautiously withdrawn. The mucosal surfaces were carefully surveyed utilizing scope tip to flexion to facilitate fold flattening as needed. The scope was pulled down into the rectum where a thorough exam including retroflexion was performed. Terminal ileum was also examined.  Findings:   Prep satisfactory. Normal mucosa of terminal ileum. 3 mm polyp ablated via cold biopsy from hepatic flexure. Random mucosal biopsies taken from normal-appearing mucosa of sigmoid colon. Normal rectal mucosa. Small hemorrhoids below the dentate line.    Therapeutic/Diagnostic Maneuvers Performed:  See above  Complications:  None  Cecal Withdrawal Time:  15 minutes  Impression:  Normal mucosa of terminal ileum. 3 mm polyp ablated via cold biopsy from the hepatic  flexure. No evidence of endoscopic colitis. Biopsy taken from mucosa of sigmoid colon looking for microscopic colitis. Small external hemorrhoids.  Comment; Chronic diarrhea is possibly secondary to IBS unless sigmoid biopsy reveals microscopic colitis.  Recommendations:  Standard instructions given. Dicyclomine 10 mg by mouth 3 times a day I will contact patient with biopsy results and further recommendations.  Rogene Houston  06/26/2013 12:37 PM  CC: Dr. Redge Gainer, MD & Dr. Rayne Du ref. provider found

## 2013-06-26 NOTE — Discharge Instructions (Signed)
Resume usual medications and diet. Dicyclomine 10 mg by mouth 15-30 minutes before each meal. No driving for 24 hours. Physician will call with biopsy results. Colonoscopy, Care After Refer to this sheet in the next few weeks. These instructions provide you with information on caring for yourself after your procedure. Your health care provider may also give you more specific instructions. Your treatment has been planned according to current medical practices, but problems sometimes occur. Call your health care provider if you have any problems or questions after your procedure. WHAT TO EXPECT AFTER THE PROCEDURE  After your procedure, it is typical to have the following:  A small amount of blood in your stool.  Moderate amounts of gas and mild abdominal cramping or bloating. HOME CARE INSTRUCTIONS  Do not drive, operate machinery, or sign important documents for 24 hours.  You may shower and resume your regular physical activities, but move at a slower pace for the first 24 hours.  Take frequent rest periods for the first 24 hours.  Walk around or put a warm pack on your abdomen to help reduce abdominal cramping and bloating.  Drink enough fluids to keep your urine clear or pale yellow.  You may resume your normal diet as instructed by your health care provider. Avoid heavy or fried foods that are hard to digest.  Avoid drinking alcohol for 24 hours or as instructed by your health care provider.  Only take over-the-counter or prescription medicines as directed by your health care provider.  If a tissue sample (biopsy) was taken during your procedure:  Do not take aspirin or blood thinners for 7 days, or as instructed by your health care provider.  Do not drink alcohol for 7 days, or as instructed by your health care provider.  Eat soft foods for the first 24 hours. SEEK MEDICAL CARE IF: You have persistent spotting of blood in your stool 2 3 days after the procedure. SEEK  IMMEDIATE MEDICAL CARE IF:  You have more than a small spotting of blood in your stool.  You pass large blood clots in your stool.  Your abdomen is swollen (distended).  You have nausea or vomiting.  You have a fever.  You have increasing abdominal pain that is not relieved with medicine.  Colon Polyps Polyps are lumps of extra tissue growing inside the body. Polyps can grow in the large intestine (colon). Most colon polyps are noncancerous (benign). However, some colon polyps can become cancerous over time. Polyps that are larger than a pea may be harmful. To be safe, caregivers remove and test all polyps. CAUSES  Polyps form when mutations in the genes cause your cells to grow and divide even though no more tissue is needed. RISK FACTORS There are a number of risk factors that can increase your chances of getting colon polyps. They include:  Being older than 50 years.  Family history of colon polyps or colon cancer.  Long-term colon diseases, such as colitis or Crohn disease.  Being overweight.  Smoking.  Being inactive.  Drinking too much alcohol. SYMPTOMS  Most small polyps do not cause symptoms. If symptoms are present, they may include:  Blood in the stool. The stool may look dark red or black.  Constipation or diarrhea that lasts longer than 1 week. DIAGNOSIS People often do not know they have polyps until their caregiver finds them during a regular checkup. Your caregiver can use 4 tests to check for polyps:  Digital rectal exam. The caregiver wears gloves  and feels inside the rectum. This test would find polyps only in the rectum.  Barium enema. The caregiver puts a liquid called barium into your rectum before taking X-rays of your colon. Barium makes your colon look white. Polyps are dark, so they are easy to see in the X-ray pictures.  Sigmoidoscopy. A thin, flexible tube (sigmoidoscope) is placed into your rectum. The sigmoidoscope has a light and tiny  camera in it. The caregiver uses the sigmoidoscope to look at the last third of your colon.  Colonoscopy. This test is like sigmoidoscopy, but the caregiver looks at the entire colon. This is the most common method for finding and removing polyps. TREATMENT  Any polyps will be removed during a sigmoidoscopy or colonoscopy. The polyps are then tested for cancer. PREVENTION  To help lower your risk of getting more colon polyps:  Eat plenty of fruits and vegetables. Avoid eating fatty foods.  Do not smoke.  Avoid drinking alcohol.  Exercise every day.  Lose weight if recommended by your caregiver.  Eat plenty of calcium and folate. Foods that are rich in calcium include milk, cheese, and broccoli. Foods that are rich in folate include chickpeas, kidney beans, and spinach. HOME CARE INSTRUCTIONS Keep all follow-up appointments as directed by your caregiver. You may need periodic exams to check for polyps. SEEK MEDICAL CARE IF: You notice bleeding during a bowel movement.

## 2013-06-26 NOTE — H&P (Signed)
Austin Gonzales is an 48 y.o. male.   Chief Complaint: Patient is here for colonoscopy. HPI: Patient is 48 year old Caucasian male with chronic diarrhea who presents with intermittent rectal bleeding. He also gives history of hemorrhoids. He had lab studies on 04/19/2013 and his hemoglobin was 12 g. Serum iron B12 and folate levels were normal. Repeat hemoglobin on 05/02/2013 was 14.3 g. Patient usually has 5-6 stools per day. Most of his bowel movements occur after meals and occasionally he has nocturnal bowel movement. When he is passes blood with his bowel movements usually small in amount. He has lost 32 pounds over the last 4 months. He states he's been under a lot of stress. He lost both parents within the last 9 months and is now separated. Family history is negative for CRC.  Past Medical History  Diagnosis Date  . Hyperlipidemia   . Anxiety   . GERD (gastroesophageal reflux disease)   . Anemia     Past Surgical History  Procedure Laterality Date  . Fracture surgery Right     Jaw    Family History  Problem Relation Age of Onset  . Cancer Mother     breast  . Heart disease Mother   . Hyperlipidemia Mother   . Atrial fibrillation Mother   . COPD Father   . Asthma Father    Social History:  reports that he has been smoking Cigarettes.  He has been smoking about 0.50 packs per day. He has never used smokeless tobacco. He reports that he does not drink alcohol or use illicit drugs.  Allergies: No Known Allergies  Medications Prior to Admission  Medication Sig Dispense Refill  . ALPRAZolam (XANAX) 0.5 MG tablet Take 1 tablet (0.5 mg total) by mouth 2 (two) times daily as needed for anxiety.  30 tablet  3  . buPROPion (WELLBUTRIN) 100 MG tablet One po qd  30 tablet  11  . cyanocobalamin 2000 MCG tablet Take by mouth daily. 5,000 a day      . ferrous sulfate 325 (65 FE) MG EC tablet Take 1 tablet (325 mg total) by mouth daily with breakfast.  30 tablet  5  . hydrocortisone  (ANUSOL-HC) 25 MG suppository Place 1 suppository (25 mg total) rectally 2 (two) times daily.  12 suppository  3  . loratadine (CLARITIN) 10 MG tablet Take 1 tablet (10 mg total) by mouth daily.  90 tablet  3  . Multiple Vitamin (MULTIVITAMIN) tablet Take 1 tablet by mouth daily.      . pravastatin (PRAVACHOL) 40 MG tablet Take 1 tablet (40 mg total) by mouth daily.  90 tablet  3  . promethazine (PHENERGAN) 25 MG tablet Take 1 tablet (25 mg total) by mouth every 8 (eight) hours as needed for nausea or vomiting.  20 tablet  0  . doxycycline (VIBRA-TABS) 100 MG tablet Take 1 tablet (100 mg total) by mouth 2 (two) times daily.  20 tablet  0  . pantoprazole (PROTONIX) 40 MG tablet Take 1 tablet (40 mg total) by mouth daily.  90 tablet  3  . PARoxetine (PAXIL) 40 MG tablet Take 1 tablet (40 mg total) by mouth every morning.  90 tablet  3    No results found for this or any previous visit (from the past 48 hour(s)). No results found.  ROS  Blood pressure 123/88, pulse 86, temperature 98.2 F (36.8 C), temperature source Oral, resp. rate 17, SpO2 96.00%. Physical Exam  Constitutional: He appears well-developed and  well-nourished.  HENT:  Mouth/Throat: Oropharynx is clear and moist.  Eyes: Conjunctivae are normal. No scleral icterus.  Neck: No thyromegaly present.  Cardiovascular: Normal rate, regular rhythm and normal heart sounds.   No murmur heard. Respiratory: Effort normal and breath sounds normal.  GI: Soft. He exhibits no distension and no mass. There is no tenderness.  Small umbilical hernia which is completely reducible  Musculoskeletal: He exhibits no edema.  Lymphadenopathy:    He has no cervical adenopathy.  Neurological: He is alert.  Skin: Skin is warm and dry.     Assessment/Plan Diarrhea and hematochezia. Patient had low hemoglobin of 12 g in March, 2015. Hemoglobin was 14.3 g on 05/02/2013. Diagnostic colonoscopy.  Austin Gonzales U Austin Gonzales 06/26/2013, 11:51 AM

## 2013-06-27 ENCOUNTER — Encounter (HOSPITAL_COMMUNITY): Payer: Self-pay | Admitting: Internal Medicine

## 2013-07-02 ENCOUNTER — Encounter: Payer: Self-pay | Admitting: Family Medicine

## 2013-07-02 ENCOUNTER — Ambulatory Visit (INDEPENDENT_AMBULATORY_CARE_PROVIDER_SITE_OTHER): Payer: PRIVATE HEALTH INSURANCE | Admitting: Family Medicine

## 2013-07-02 VITALS — BP 126/81 | HR 101 | Temp 98.3°F | Ht 70.0 in | Wt 186.0 lb

## 2013-07-02 DIAGNOSIS — F329 Major depressive disorder, single episode, unspecified: Secondary | ICD-10-CM

## 2013-07-02 DIAGNOSIS — F3289 Other specified depressive episodes: Secondary | ICD-10-CM

## 2013-07-02 DIAGNOSIS — F32A Depression, unspecified: Secondary | ICD-10-CM

## 2013-07-02 DIAGNOSIS — F411 Generalized anxiety disorder: Secondary | ICD-10-CM

## 2013-07-02 MED ORDER — ALPRAZOLAM 0.5 MG PO TABS: 0.5000 mg | ORAL_TABLET | Freq: Two times a day (BID) | ORAL | Status: AC | PRN

## 2013-07-03 NOTE — Progress Notes (Signed)
   Subjective:    Patient ID: Austin Gonzales, male    DOB: September 19, 1965, 48 y.o.   MRN: [id number]  HPI This 48 y.o. male presents for evaluation of GAD and depression which is not getting better and he has gone back to drinking a lot.  He has been worried about a lot of things and he has preoccupation With his ventral hernia around his umbilicus.  Review of Systems No chest pain, SOB, HA, dizziness, vision change, N/V, diarrhea, constipation, dysuria, urinary urgency or frequency, myalgias, arthralgias or rash.     Objective:   Physical Exam  Vital signs noted  Well developed well nourished male.  HEENT - Head atraumatic Normocephalic                Eyes - PERRLA, Conjuctiva - clear Sclera- Clear EOMI                Ears - EAC's Wnl TM's Wnl Gross Hearing WNL                Nose - Nares patent                 Throat - oropharanx wnl Respiratory - Lungs CTA bilateral Cardiac - RRR S1 and S2 without murmur GI - Abdomen soft Nontender, small umbilical hernia, and bowel sounds active x 4 Extremities - No edema. Neuro - Grossly intact.      Assessment & Plan:  Depression - Plan: ALPRAZolam (XANAX) 0.5 MG tablet  Anxiety state, unspecified - Plan: ALPRAZolam (XANAX) 0.5 MG tablet  .Follow up with psychiatry ASAP and have given patient a list of psychiatric providers to see.  Lysbeth Penner FNP

## 2013-08-12 IMAGING — US US ABDOMEN COMPLETE
1 series · 14 of 25 positions shown · non-contrast
Comparison: CT abdomen and pelvis of  08/13/2006

CLINICAL DATA: Right upper quadrant pain

COMPLETE ABDOMINAL ULTRASOUND

[Series 1: us abdomen complete · 0.30mm/px · 14 of 63 slices shown]
[im 1/63]
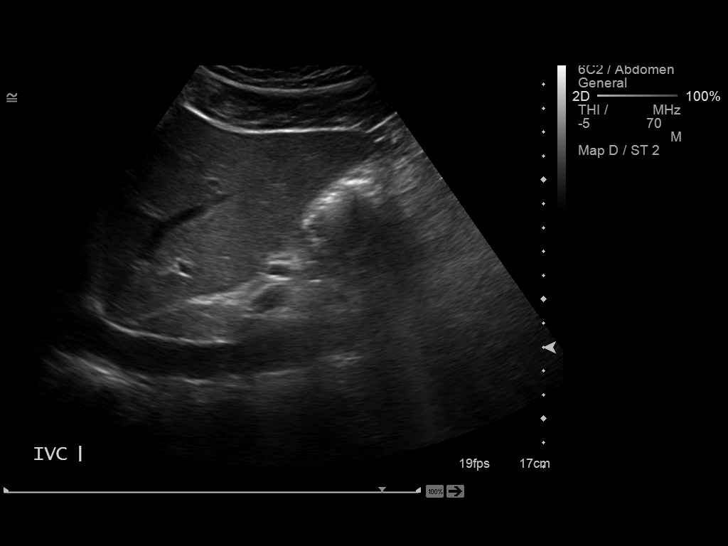
[im 6/63]
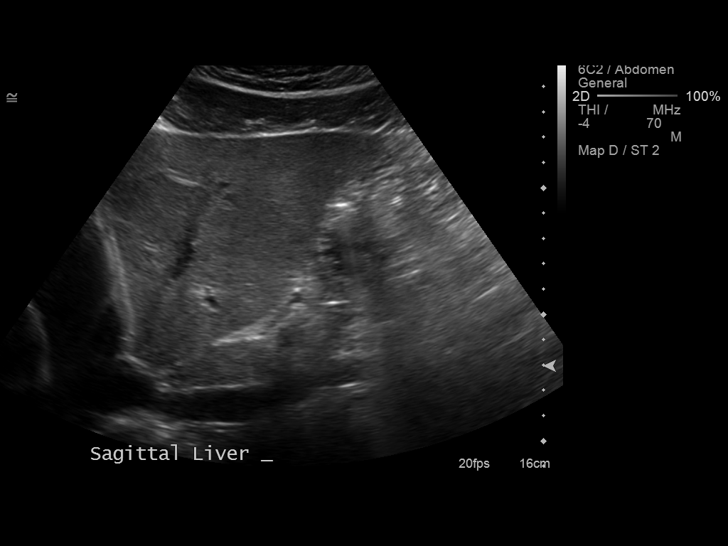
[im 11/63]
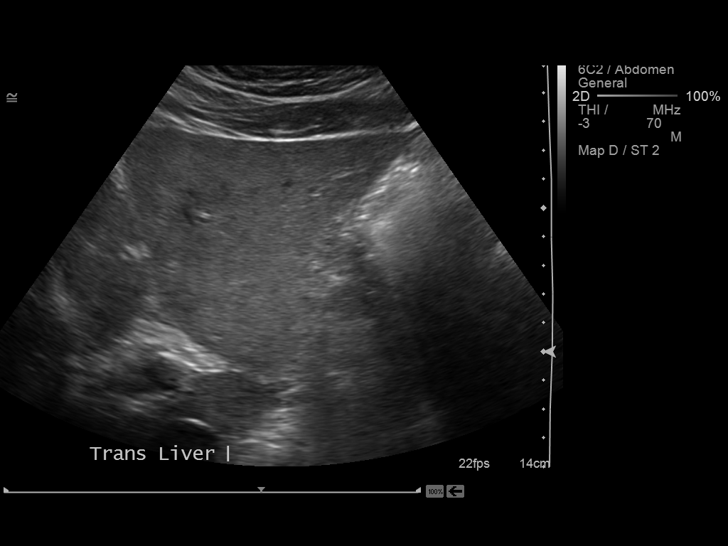
[im 16/63]
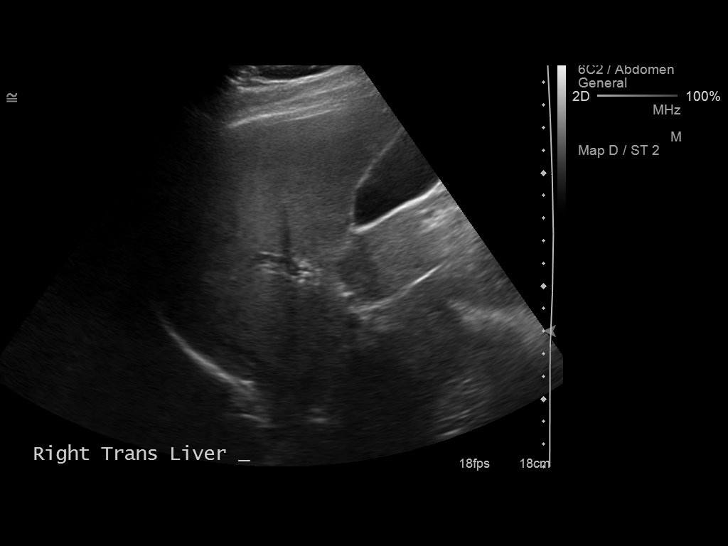
[im 21/63]
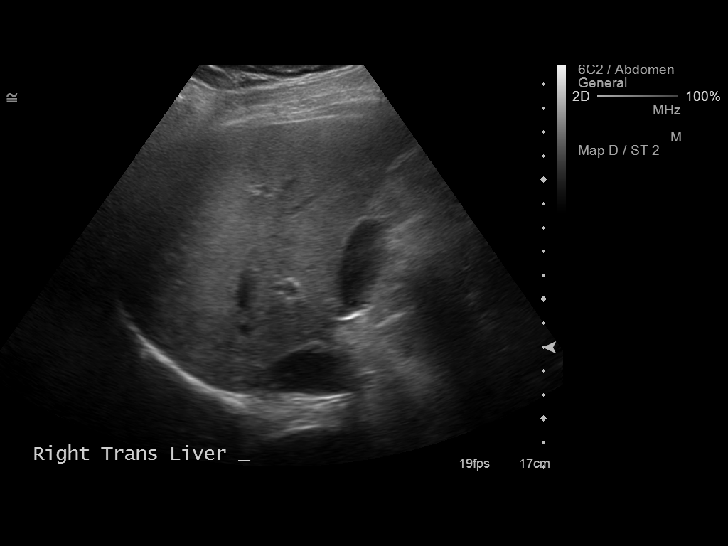
[im 24/63]
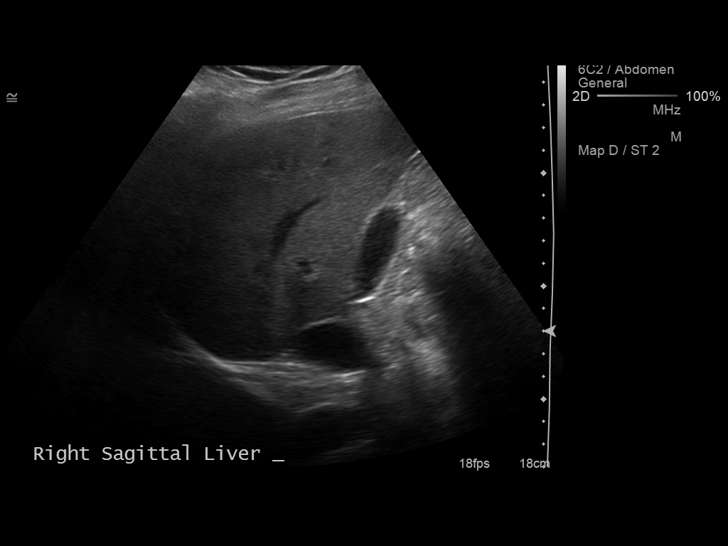
[im 29/63]
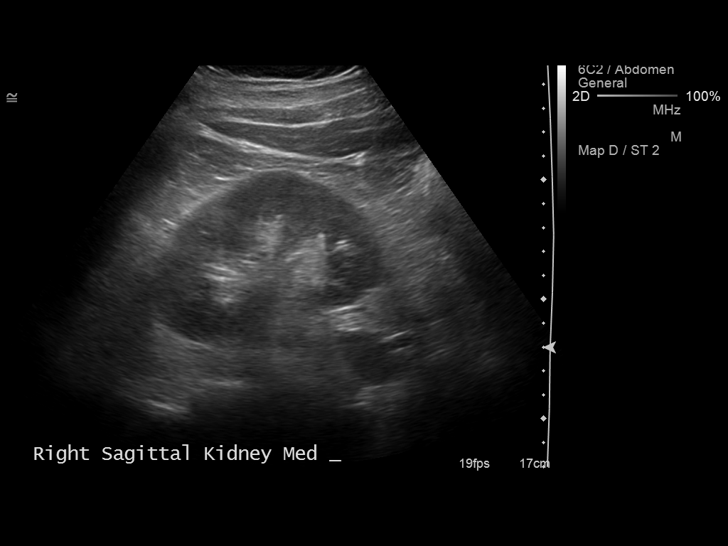
[im 34/63]
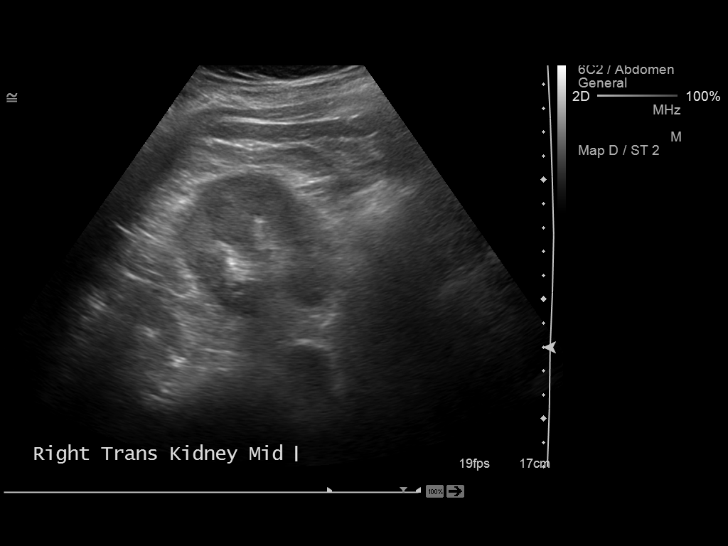
[im 39/63]
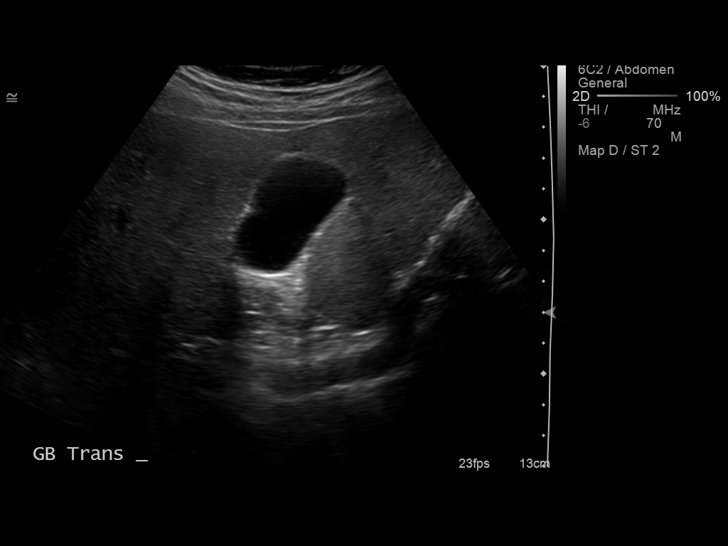
[im 42/63]
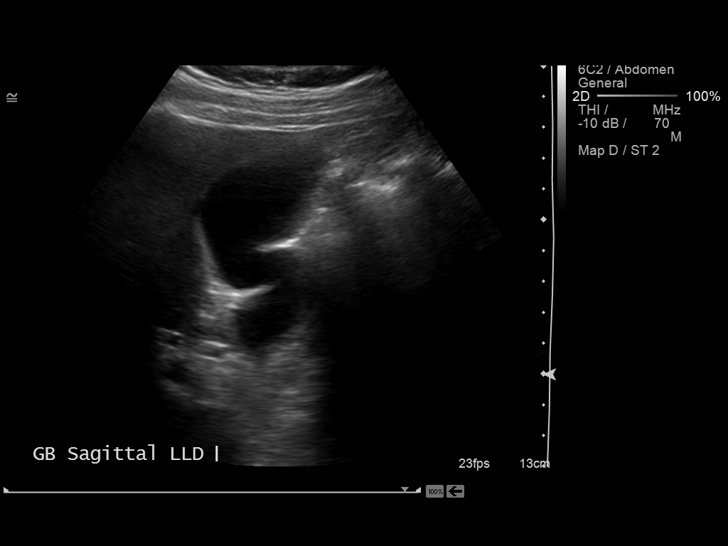
[im 47/63]
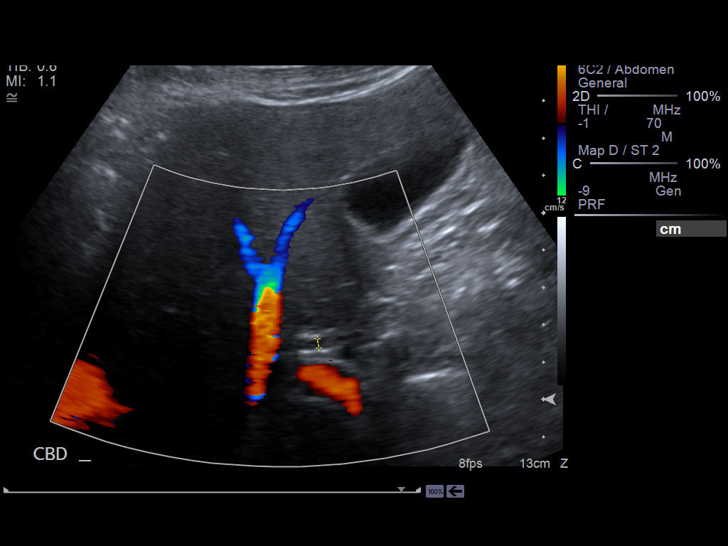
[im 52/63]
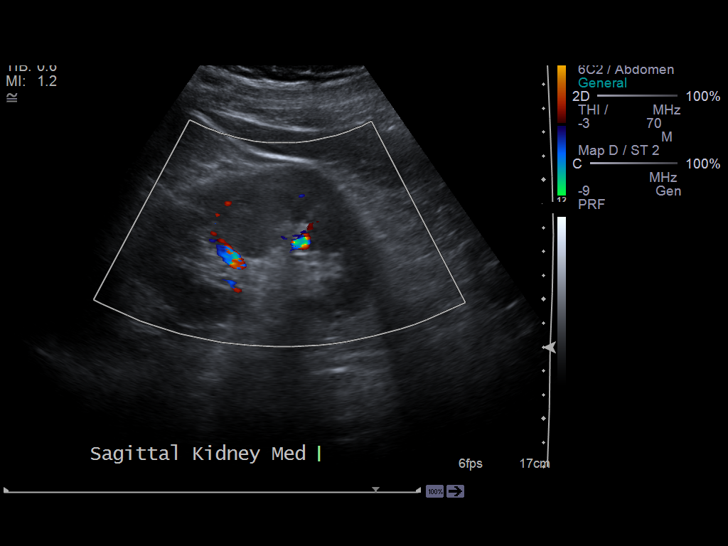
[im 57/63]
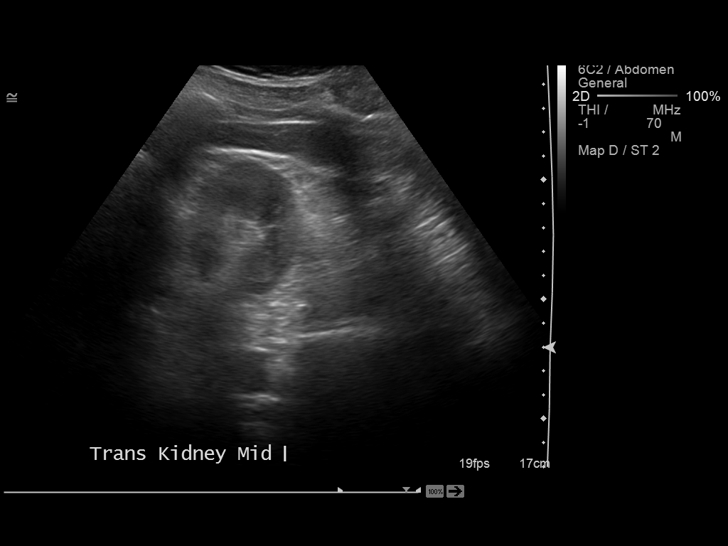
[im 63/63]
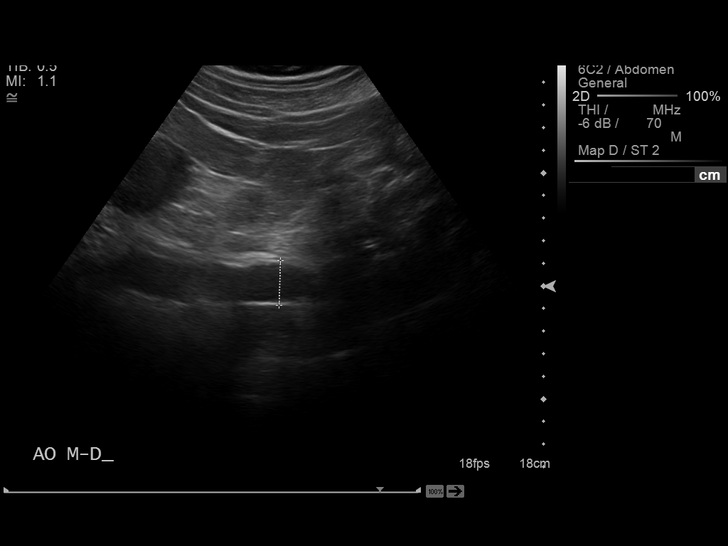

[14 of 25 positions shown; findings below may reference images not displayed]

FINDINGS: Gallbladder:  The gallbladder folds upon itself, but no gallstones
are seen.  There is no pain over the gallbladder with compression.

Common bile duct:  The common bile duct is normal measuring 4.1 mm
in diameter.

Liver:   The liver has a normal echogenic pattern.  No ductal
dilatation is seen.

IVC:  Appears normal.

Pancreas:  No focal abnormality seen.

Spleen:  The spleen is normal measuring 5.7 cm sagittally.

Right Kidney:  No hydronephrosis is seen.  The right kidney
measures 10.93.

Left Kidney:  No hydronephrosis is noted.  The left kidney measures
10.7 cm.

Abdominal aorta:  The abdominal aorta is normal in caliber.
IMPRESSION: Negative abdominal ultrasound.

## 2013-08-19 ENCOUNTER — Other Ambulatory Visit (INDEPENDENT_AMBULATORY_CARE_PROVIDER_SITE_OTHER): Payer: PRIVATE HEALTH INSURANCE

## 2013-08-19 DIAGNOSIS — R7989 Other specified abnormal findings of blood chemistry: Secondary | ICD-10-CM

## 2013-08-19 DIAGNOSIS — Z79899 Other long term (current) drug therapy: Secondary | ICD-10-CM

## 2013-08-19 DIAGNOSIS — F3132 Bipolar disorder, current episode depressed, moderate: Secondary | ICD-10-CM

## 2013-08-19 NOTE — Progress Notes (Signed)
Pt came in for labs only for dr. Candis Schatz

## 2013-08-20 ENCOUNTER — Telehealth: Payer: Self-pay | Admitting: Family Medicine

## 2013-08-20 LAB — HEPATIC FUNCTION PANEL
ALT: 10 IU/L (ref 0–44)
AST: 14 IU/L (ref 0–40)
Albumin: 4.5 g/dL (ref 3.5–5.5)
Alkaline Phosphatase: 55 IU/L (ref 39–117)
Bilirubin, Direct: 0.04 mg/dL (ref 0.00–0.40)
Total Bilirubin: <0.2 mg/dL (ref 0.0–1.2)
Total Protein: 6.6 g/dL (ref 6.0–8.5)

## 2013-08-20 LAB — CBC WITH DIFFERENTIAL
Basophils Absolute: 0 10*3/uL (ref 0.0–0.2)
Basos: 1 %
Eos: 4 %
Eosinophils Absolute: 0.2 10*3/uL (ref 0.0–0.4)
HCT: 39.6 % (ref 37.5–51.0)
Hemoglobin: 13.1 g/dL (ref 12.6–17.7)
Immature Grans (Abs): 0 10*3/uL (ref 0.0–0.1)
Immature Granulocytes: 0 %
Lymphocytes Absolute: 2.4 10*3/uL (ref 0.7–3.1)
Lymphs: 37 %
MCH: 33.2 pg — ABNORMAL HIGH (ref 26.6–33.0)
MCHC: 33.1 g/dL (ref 31.5–35.7)
MCV: 100 fL — ABNORMAL HIGH (ref 79–97)
Monocytes Absolute: 0.7 10*3/uL (ref 0.1–0.9)
Monocytes: 12 %
Neutrophils Absolute: 3 10*3/uL (ref 1.4–7.0)
Neutrophils Relative %: 46 %
Platelets: 262 10*3/uL (ref 150–379)
RBC: 3.95 x10E6/uL — ABNORMAL LOW (ref 4.14–5.80)
RDW: 13 % (ref 12.3–15.4)
WBC: 6.4 10*3/uL (ref 3.4–10.8)

## 2013-08-20 LAB — VALPROIC ACID LEVEL: Valproic Acid Lvl: 68 ug/mL (ref 50–100)

## 2013-08-20 NOTE — Telephone Encounter (Signed)
Message copied by Waverly Ferrari on Tue Aug 20, 2013 10:55 AM ------      Message from: Lysbeth Penner      Created: Tue Aug 20, 2013 10:31 AM       Labs normal ------

## 2013-09-12 ENCOUNTER — Encounter (INDEPENDENT_AMBULATORY_CARE_PROVIDER_SITE_OTHER): Payer: Self-pay

## 2013-09-12 ENCOUNTER — Encounter (INDEPENDENT_AMBULATORY_CARE_PROVIDER_SITE_OTHER): Payer: Self-pay | Admitting: General Surgery

## 2013-09-12 ENCOUNTER — Ambulatory Visit (INDEPENDENT_AMBULATORY_CARE_PROVIDER_SITE_OTHER): Payer: No Typology Code available for payment source | Admitting: General Surgery

## 2013-09-12 VITALS — BP 122/90 | HR 84 | Temp 98.9°F | Ht 69.0 in | Wt 176.5 lb

## 2013-09-12 DIAGNOSIS — K420 Umbilical hernia with obstruction, without gangrene: Secondary | ICD-10-CM

## 2013-09-12 DIAGNOSIS — Z8719 Personal history of other diseases of the digestive system: Secondary | ICD-10-CM | POA: Insufficient documentation

## 2013-09-12 DIAGNOSIS — Z9889 Other specified postprocedural states: Secondary | ICD-10-CM

## 2013-09-12 NOTE — Progress Notes (Signed)
Patient ID: Austin Gonzales, male   DOB: 09-06-1965, 48 y.o.   MRN: [id number]  Chief Complaint  Patient presents with  . Hernia    HPI Austin Gonzales is a 48 y.o. male.  He is referred by Dr. Morrie Sheldon at [facility] and by [facility] for evaluation and management of a symptomatic umbilical hernia.  The patient thinks he's had an umbilical hernia for a few months. The wife thinks it's been a year or 2. It recently became painful and larger. He wants to have something done. No prior history of hernia.  Comorbidities include ongoing tobacco and alcohol use. Irritable bowel syndrome and chronic diarrhea. Recent colonoscopy unremarkable. Depression on Paxil and Xanax. GERD. Traumatic fracture of mandible in past..  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 They have 2 children. HPI  Past Medical History  Diagnosis Date  . Hyperlipidemia   . Anxiety   . GERD (gastroesophageal reflux disease)   . Anemia     Past Surgical History  Procedure Laterality Date  . Fracture surgery Right     Jaw  . Colonoscopy N/A 06/26/2013    Procedure: COLONOSCOPY;  Surgeon: Rogene Houston, MD;  Location: [facility];  Service: Endoscopy;  Laterality: N/A;  1200    Family History  Problem Relation Age of Onset  . Cancer Mother     breast  . Heart disease Mother   . Hyperlipidemia Mother   . Atrial fibrillation Mother   . COPD Father   . Asthma Father     Social History History  Substance Use Topics  . Smoking status: Current Every Day Smoker -- 0.50 packs/day    Types: Cigarettes  . Smokeless tobacco: Never Used     Comment: 1 pack a day x 15 yrs  . Alcohol Use: No    Allergies  Allergen Reactions  . Latex     blister    Current Outpatient Prescriptions  Medication Sig Dispense Refill  . ALPRAZolam (XANAX) 0.5 MG tablet Take 1 tablet (0.5 mg total) by mouth 2 (two) times daily as needed for anxiety.  60  tablet  0  . cyanocobalamin 2000 MCG tablet Take by mouth daily. 5,000 a day      . dicyclomine (BENTYL) 10 MG capsule Take 1 capsule (10 mg total) by mouth 3 (three) times daily before meals.  90 capsule  5  . loratadine (CLARITIN) 10 MG tablet Take 1 tablet (10 mg total) by mouth daily.  90 tablet  3  . Multiple Vitamin (MULTIVITAMIN) tablet Take 1 tablet by mouth daily.      . pantoprazole (PROTONIX) 40 MG tablet Take 1 tablet (40 mg total) by mouth daily.  90 tablet  3  . pravastatin (PRAVACHOL) 40 MG tablet Take 1 tablet (40 mg total) by mouth daily.  90 tablet  3  . PARoxetine (PAXIL) 40 MG tablet Take 1 tablet (40 mg total) by mouth every morning.  90 tablet  3  . promethazine (PHENERGAN) 25 MG tablet Take 1 tablet (25 mg total) by mouth every 8 (eight) hours as needed for nausea or vomiting.  20 tablet  0   No current facility-administered medications for this visit.    Review of Systems Review of Systems  Constitutional: Negative for fever, chills and unexpected weight change.  HENT: Negative for congestion, hearing loss, sore throat, trouble swallowing and voice change.   Eyes: Negative for  visual disturbance.  Respiratory: Negative for cough and wheezing.   Cardiovascular: Negative for chest pain, palpitations and leg swelling.  Gastrointestinal: Positive for abdominal pain and diarrhea. Negative for nausea, vomiting, constipation, blood in stool, abdominal distention, anal bleeding and rectal pain.  Genitourinary: Negative for hematuria and difficulty urinating.  Musculoskeletal: Negative for arthralgias.  Skin: Negative for rash and wound.  Neurological: Negative for seizures, syncope, weakness and headaches.  Hematological: Negative for adenopathy. Does not bruise/bleed easily.  Psychiatric/Behavioral: Negative for confusion. The patient is nervous/anxious.     Blood pressure 122/90, pulse 84, temperature 98.9 F (37.2 C), temperature source Oral, height 5\' 9"  (1.753 m),  weight 176 lb 8 oz (80.06 kg).  Physical Exam Physical Exam  Constitutional: He is oriented to person, place, and time. He appears well-developed and well-nourished. No distress.  HENT:  Head: Normocephalic.  Nose: Nose normal.  Mouth/Throat: No oropharyngeal exudate.  Scars along  right mandible  Eyes: Conjunctivae and EOM are normal. Pupils are equal, round, and reactive to light. Right eye exhibits no discharge. Left eye exhibits no discharge. No scleral icterus.  Neck: Normal range of motion. Neck supple. No JVD present. No tracheal deviation present. No thyromegaly present.  Cardiovascular: Normal rate, regular rhythm, normal heart sounds and intact distal pulses.   No murmur heard. Pulmonary/Chest: Effort normal and breath sounds normal. No stridor. No respiratory distress. He has no wheezes. He has no rales. He exhibits no tenderness.  Abdominal: Soft. Bowel sounds are normal. He exhibits no distension and no mass. There is no tenderness. There is no rebound and no guarding.  Incarcerated, nonreducible umbilical hernia. Skin healthy. Hernia sac about 3 cm. Defect possibly smaller. No inguinal hernias. Abdomen benign.  Musculoskeletal: Normal range of motion. He exhibits no edema and no tenderness.  Lymphadenopathy:    He has no cervical adenopathy.  Neurological: He is alert and oriented to person, place, and time. He has normal reflexes. Coordination normal.  Skin: Skin is warm and dry. No rash noted. He is not diaphoretic. No erythema. No pallor.  Psychiatric: He has a normal mood and affect. His behavior is normal. Judgment and thought content normal.    Data Reviewed Office notes from urgent care  Assessment    Incarcerated umbilical hernia, symptomatic, desires repair  Chronic anxiety  Parallel syndrome  Tobacco abuse, 1 pack per day  Alcohol use, 24 beers per week     Plan    Scheduled for elective repair of umbilical hernia with mesh. Anterior approach. Inlay  mesh  I discussed temporary disability and restrictions with him. No sports or heavy lifting for one month.  I discussed the indications, details, techniques, and numerous risk of the surgery with the patient and his wife. He is aware of the risk of bleeding, infection, recurrence, injury to adjacent organs with major reconstructive surgery, nerve damage with chronic pain. He understands all these issues well. At this time all his questions are answered. He agrees with this plan.        Rogelio Waynick M 09/12/2013, 3:33 PM

## 2013-09-12 NOTE — Patient Instructions (Signed)
You have an umbilical hernia that has been getting larger and becoming uncomfortable.  The hernia is incarcerated, meaning that there is some fatty tissue trapped in the hernia that I cannot push back in.  You'll be scheduled for umbilical hernia repair with mesh in the near future.        Umbilical Herniorrhaphy Herniorrhaphy is surgery to repair a hernia. A hernia is the protrusion of a part of an organ through an abdominal opening. An umbilical hernia means that your hernia is in the area around your navel. If the hernia is not repaired, the gap could get bigger. Your intestines or other tissues, such as fat, could get trapped in the gap. This can lead to other health problems, such as blocked intestines. If the hernia is fixed before problems set in, you may be allowed to go home the same day as the surgery (outpatient). LET [facility] CARE PROVIDER KNOW ABOUT:  Allergies to food or medicine.  Medicines taken, including vitamins, herbs, eye drops, over-the-counter medicines, and creams.  Use of steroids (by mouth or creams).  Previous problems with anesthetics or numbing medicines.  History of bleeding problems or blood clots.  Previous surgery.  Other health problems, including diabetes and kidney problems.  Possibility of pregnancy, if this applies. RISKS AND COMPLICATIONS  Pain.  Excessive bleeding.  Hematoma. This is a pocket of blood that collects under the surgery site.  Infection at the surgery site.  Numbness at the surgery site.  Swelling and bruising.  Blood clots.  Intestinal damage (rare).  Scarring.  Skin damage.  Development of another hernia. This may require another surgery. BEFORE THE PROCEDURE  Ask your health care provider about changing or stopping your regular medicines. You may need to stop taking aspirin, nonsteroidal anti-inflammatory drugs (NSAIDs), vitamin E, and blood thinners as early as 2 weeks before the procedure.  Do not eat  or drink for 8 hours before the procedure, or as directed by your health care provider.  You might be asked to shower or wash with an antibacterial soap before the procedure.  Wear comfortable clothes that will be easy to put on after the procedure. PROCEDURE You will be given an intravenous (IV) tube. A needle will be inserted in your arm. Medicine will flow directly into your body through this needle. You might be given medicine to help you relax (sedative). You will be given medicine that numbs the area (local anesthetic) or medicine that makes you sleep (general anesthetic). If you have open surgery:  The surgeon will make a cut (incision) in your abdomen.  The gap in the muscle wall will be repaired. The surgeon may sew the edges together over the gap or use a mesh material to strengthen the area. When mesh is used, the body grows new, strong tissue into and around it. This new tissue closes the gap.  A drain might be put in to remove excess fluid from the body after surgery.  The surgeon will close the incision with stitches, glue, or staples. If you have laparoscopic surgery:  The surgeon will make several small incisions in your abdomen.  A thin, lighted tube (laparoscope) will be inserted into the abdomen through an incision. A camera is attached to the laparoscope that allows the surgeon to see inside the abdomen.  Tools will be inserted through the other incisions to repair the hernia. Usually, mesh is used to cover the gap.  The surgeon will close the incisions with stitches. AFTER THE  PROCEDURE  You will be taken to a recovery area. A nurse will watch and check your progress.  When you are awake, feeling well, and taking fluids well, you may be allowed to go home. In some cases, you may need to stay overnight in the hospital.  Arrange for someone to drive you home. Document Released: 04/08/2008 Document Revised: 05/27/2013 Document Reviewed: 04/13/2011 [facility] Patient  Information 2015 [place], [place]. This information is not intended to replace advice given to you by your health care provider. Make sure you discuss any questions you have with your health care provider.

## 2013-09-26 ENCOUNTER — Ambulatory Visit (INDEPENDENT_AMBULATORY_CARE_PROVIDER_SITE_OTHER): Payer: PRIVATE HEALTH INSURANCE | Admitting: Nurse Practitioner

## 2013-09-26 ENCOUNTER — Encounter: Payer: Self-pay | Admitting: Nurse Practitioner

## 2013-09-26 VITALS — BP 111/75 | HR 74 | Temp 98.0°F | Ht 69.0 in | Wt 177.8 lb

## 2013-09-26 DIAGNOSIS — R1110 Vomiting, unspecified: Secondary | ICD-10-CM

## 2013-09-26 DIAGNOSIS — R1115 Cyclical vomiting syndrome unrelated to migraine: Secondary | ICD-10-CM

## 2013-09-26 DIAGNOSIS — K429 Umbilical hernia without obstruction or gangrene: Secondary | ICD-10-CM

## 2013-09-26 MED ORDER — ZOLPIDEM TARTRATE 10 MG PO TABS: 10.0000 mg | ORAL_TABLET | Freq: Every evening | ORAL | Status: DC | PRN

## 2013-09-26 MED ORDER — PROMETHAZINE HCL 25 MG PO TABS: 25.0000 mg | ORAL_TABLET | Freq: Three times a day (TID) | ORAL | Status: DC | PRN

## 2013-09-26 MED ORDER — LORATADINE 10 MG PO TABS: 10.0000 mg | ORAL_TABLET | Freq: Every day | ORAL | Status: DC

## 2013-09-26 NOTE — Progress Notes (Signed)
   Subjective:    Patient ID: Austin Gonzales, male    DOB: 1965/10/23, 48 y.o.   MRN: [id number]  HPI  Patient in today c/o nausea and vomiting- thinks that it is coming from umbilical hernia. He says that hernia is getting larger- Does a lot of tugging and pulling at work. Is tp be scheduled for surgry mid month.    Review of Systems  Constitutional: Negative.   HENT: Negative.   Respiratory: Negative.   Cardiovascular: Negative.   Gastrointestinal: Positive for nausea and vomiting.  Genitourinary: Negative.   Musculoskeletal: Negative.   Neurological: Negative.   Psychiatric/Behavioral: Negative.   All other systems reviewed and are negative.      Objective:   Physical Exam  Constitutional: He is oriented to person, place, and time. He appears well-developed and well-nourished.  Cardiovascular: Normal rate, regular rhythm and normal heart sounds.   Pulmonary/Chest: Effort normal and breath sounds normal.  Abdominal: There is tenderness (reducible umbilical hernia).  Neurological: He is alert and oriented to person, place, and time. He has normal reflexes.  Skin: Skin is warm and dry.  Psychiatric: He has a normal mood and affect. His behavior is normal. Judgment and thought content normal.  BP 111/75  Pulse 74  Temp(Src) 98 F (36.7 C) (Oral)  Ht 5\' 9"  (1.753 m)  Wt 177 lb 12.8 oz (80.65 kg)  BMI 26.24 kg/m2         Assessment & Plan:   1. Intractable vomiting with nausea, vomiting of unspecified type   2. Umbilical hernia without obstruction and without gangrene    Meds ordered this encounter  Medications  . promethazine (PHENERGAN) 25 MG tablet    Sig: Take 1 tablet (25 mg total) by mouth every 8 (eight) hours as needed for nausea or vomiting.    Dispense:  30 tablet    Refill:  0    Order Specific Question:  Supervising Provider    Answer:  Chipper Herb [[id number]]   Keep appointment with surgeon No heavy lifting at work RTO prn To [facility] if not  reducible  Mary-Margaret Hassell Done, FNP

## 2013-09-26 NOTE — Addendum Note (Signed)
Addended by: Chevis Pretty on: 09/26/2013 03:30 PM   Modules accepted: Orders

## 2013-09-26 NOTE — Addendum Note (Signed)
Addended by: Chevis Pretty on: 09/26/2013 03:28 PM   Modules accepted: Orders

## 2013-09-26 NOTE — Patient Instructions (Signed)

## 2014-04-01 ENCOUNTER — Telehealth: Payer: Self-pay | Admitting: Nurse Practitioner

## 2014-04-01 NOTE — Telephone Encounter (Signed)
Pt given appt with MMM 3/11 at 4:15.

## 2014-04-04 ENCOUNTER — Ambulatory Visit (INDEPENDENT_AMBULATORY_CARE_PROVIDER_SITE_OTHER): Payer: PRIVATE HEALTH INSURANCE | Admitting: Nurse Practitioner

## 2014-04-04 ENCOUNTER — Encounter: Payer: Self-pay | Admitting: Nurse Practitioner

## 2014-04-04 VITALS — BP 112/80 | HR 82 | Temp 97.8°F | Ht 69.0 in | Wt 190.0 lb

## 2014-04-04 DIAGNOSIS — J309 Allergic rhinitis, unspecified: Secondary | ICD-10-CM | POA: Diagnosis not present

## 2014-04-04 DIAGNOSIS — K589 Irritable bowel syndrome without diarrhea: Secondary | ICD-10-CM | POA: Diagnosis not present

## 2014-04-04 DIAGNOSIS — F4322 Adjustment disorder with anxiety: Secondary | ICD-10-CM | POA: Diagnosis not present

## 2014-04-04 DIAGNOSIS — K219 Gastro-esophageal reflux disease without esophagitis: Secondary | ICD-10-CM | POA: Diagnosis not present

## 2014-04-04 DIAGNOSIS — E785 Hyperlipidemia, unspecified: Secondary | ICD-10-CM | POA: Diagnosis not present

## 2014-04-04 DIAGNOSIS — M545 Low back pain, unspecified: Secondary | ICD-10-CM

## 2014-04-04 LAB — POCT CBC
GRANULOCYTE PERCENT: 56.2 % (ref 37–80)
HCT, POC: 39.9 % — AB (ref 43.5–53.7)
Hemoglobin: 12.6 g/dL — AB (ref 14.1–18.1)
LYMPH, POC: 3.4 (ref 0.6–3.4)
MCH: 31.1 pg (ref 27–31.2)
MCHC: 31.5 g/dL — AB (ref 31.8–35.4)
MCV: 98.7 fL — AB (ref 80–97)
MPV: 7.8 fL (ref 0–99.8)
PLATELET COUNT, POC: 271 10*3/uL (ref 142–424)
POC Granulocyte: 5 (ref 2–6.9)
POC LYMPH PERCENT: 38.4 %L (ref 10–50)
RBC: 4.04 M/uL — AB (ref 4.69–6.13)
RDW, POC: 12.6 %
WBC: 8.9 10*3/uL (ref 4.6–10.2)

## 2014-04-04 MED ORDER — CYCLOBENZAPRINE HCL 10 MG PO TABS: 10.0000 mg | ORAL_TABLET | Freq: Three times a day (TID) | ORAL | Status: DC | PRN

## 2014-04-04 MED ORDER — CETIRIZINE HCL 10 MG PO CAPS: 1.0000 | ORAL_CAPSULE | Freq: Every day | ORAL | Status: DC

## 2014-04-04 MED ORDER — DICYCLOMINE HCL 10 MG PO CAPS: 10.0000 mg | ORAL_CAPSULE | Freq: Three times a day (TID) | ORAL | Status: DC

## 2014-04-04 MED ORDER — FLUTICASONE PROPIONATE 50 MCG/ACT NA SUSP: NASAL | Status: DC

## 2014-04-04 MED ORDER — PRAVASTATIN SODIUM 40 MG PO TABS: 40.0000 mg | ORAL_TABLET | Freq: Every day | ORAL | Status: DC

## 2014-04-04 NOTE — Patient Instructions (Signed)
Back Pain, Adult Low back pain is very common. About 1 in 5 people have back pain.The cause of low back pain is rarely dangerous. The pain often gets better over time.About half of people with a sudden onset of back pain feel better in just 2 weeks. About 8 in 10 people feel better by 6 weeks.  CAUSES Some common causes of back pain include:  Strain of the muscles or ligaments supporting the spine.  Wear and tear (degeneration) of the spinal discs.  Arthritis.  Direct injury to the back. DIAGNOSIS Most of the time, the direct cause of low back pain is not known.However, back pain can be treated effectively even when the exact cause of the pain is unknown.Answering your caregiver's questions about your overall health and symptoms is one of the most accurate ways to make sure the cause of your pain is not dangerous. If your caregiver needs more information, he or she may order lab work or imaging tests (X-rays or MRIs).However, even if imaging tests show changes in your back, this usually does not require surgery. HOME CARE INSTRUCTIONS For many people, back pain returns.Since low back pain is rarely dangerous, it is often a condition that people can learn to manageon their own.   Remain active. It is stressful on the back to sit or stand in one place. Do not sit, drive, or stand in one place for more than 30 minutes at a time. Take short walks on level surfaces as soon as pain allows.Try to increase the length of time you walk each day.  Do not stay in bed.Resting more than 1 or 2 days can delay your recovery.  Do not avoid exercise or work.Your body is made to move.It is not dangerous to be active, even though your back may hurt.Your back will likely heal faster if you return to being active before your pain is gone.  Pay attention to your body when you bend and lift. Many people have less discomfortwhen lifting if they bend their knees, keep the load close to their bodies,and  avoid twisting. Often, the most comfortable positions are those that put less stress on your recovering back.  Find a comfortable position to sleep. Use a firm mattress and lie on your side with your knees slightly bent. If you lie on your back, put a pillow under your knees.  Only take over-the-counter or prescription medicines as directed by your caregiver. Over-the-counter medicines to reduce pain and inflammation are often the most helpful.Your caregiver may prescribe muscle relaxant drugs.These medicines help dull your pain so you can more quickly return to your normal activities and healthy exercise.  Put ice on the injured area.  Put ice in a plastic bag.  Place a towel between your skin and the bag.  Leave the ice on for 15-20 minutes, 03-04 times a day for the first 2 to 3 days. After that, ice and heat may be alternated to reduce pain and spasms.  Ask your caregiver about trying back exercises and gentle massage. This may be of some benefit.  Avoid feeling anxious or stressed.Stress increases muscle tension and can worsen back pain.It is important to recognize when you are anxious or stressed and learn ways to manage it.Exercise is a great option. SEEK MEDICAL CARE IF:  You have pain that is not relieved with rest or medicine.  You have pain that does not improve in 1 week.  You have new symptoms.  You are generally not feeling well. SEEK   IMMEDIATE MEDICAL CARE IF:   You have pain that radiates from your back into your legs.  You develop new bowel or bladder control problems.  You have unusual weakness or numbness in your arms or legs.  You develop nausea or vomiting.  You develop abdominal pain.  You feel faint. Document Released: 01/10/2005 Document Revised: 07/12/2011 Document Reviewed: 05/14/2013 [employer] Patient Information 2015 [employer]. This information is not intended to replace advice given to you by your health care provider. Make sure you  discuss any questions you have with your health care provider.  

## 2014-04-04 NOTE — Progress Notes (Signed)
Subjective:    Patient ID: Austin Gonzales, male    DOB: [birth year], 49 y.o.   MRN: [id number]   Patient here today for follow up of chronic medical problems.Only complaint is of right lower back pain that started about 2 weeks ago. Dull pain rated 6/10- intermittent- work increases pain- heating pad helps some- has not tried any oral meds.   Hyperlipidemia This is a chronic problem. The current episode started more than 1 year ago. The problem is uncontrolled. Recent lipid tests were reviewed and are variable. Exacerbating diseases include obesity. He has no history of diabetes or hypothyroidism. Current antihyperlipidemic treatment includes statins. The current treatment provides moderate improvement of lipids. Compliance problems include adherence to diet and adherence to exercise.  Risk factors for coronary artery disease include dyslipidemia, family history and obesity.  IBS Bentyl daily- has flare ups 2-3 x a week- DOes not watch diet. Bipolar depakote daily- keeps him calm - says that he is under good control Allergic rhinitis claritan daily- no problems   Review of Systems  Constitutional: Negative.   HENT: Positive for congestion. Negative for sore throat.   Respiratory: Positive for cough (only in mornings).   Cardiovascular: Negative.   Gastrointestinal: Negative.   Genitourinary: Negative for dysuria, urgency and frequency.  Musculoskeletal: Negative.   Neurological: Positive for headaches.  Psychiatric/Behavioral: Negative.   All other systems reviewed and are negative.      Objective:   Physical Exam  Constitutional: He is oriented to person, place, and time. He appears well-developed and well-nourished.  HENT:  Head: Normocephalic.  Right Ear: External ear normal.  Left Ear: External ear normal.  Nose: Mucosal edema and rhinorrhea present. Right sinus exhibits no maxillary sinus tenderness and no frontal sinus tenderness. Left sinus exhibits no maxillary sinus  tenderness and no frontal sinus tenderness.  Mouth/Throat: Oropharynx is clear and moist and mucous membranes are normal.  Eyes: EOM are normal. Pupils are equal, round, and reactive to light.  Neck: Normal range of motion. Neck supple. No JVD present. No thyromegaly present.  Cardiovascular: Normal rate, regular rhythm, normal heart sounds and intact distal pulses.  Exam reveals no gallop and no friction rub.   No murmur heard. Pulmonary/Chest: Effort normal and breath sounds normal. No respiratory distress. He has no wheezes. He has no rales. He exhibits no tenderness.  Abdominal: Soft. Bowel sounds are normal. He exhibits no mass. There is no tenderness.  Genitourinary: Prostate normal and penis normal.  Musculoskeletal: Normal range of motion. He exhibits no edema.  FROM of lumbar spine with pain of full extension (-) Slr bil Motor strength and sensation intact  Lymphadenopathy:    He has no cervical adenopathy.  Neurological: He is alert and oriented to person, place, and time. He has normal reflexes. No cranial nerve deficit.  Skin: Skin is warm and dry.  Psychiatric: He has a normal mood and affect. His behavior is normal. Judgment and thought content normal.    BP 112/80 mmHg  Pulse 82  Temp(Src) 97.8 F (36.6 C) (Oral)  Ht _0  (1.753 m)  Wt 190 lb (86.183 kg)  BMI 28.05 kg/m2       Assessment & Plan:  1. Gastroesophageal reflux disease without esophagitis Avoid all spicy foods Do not eat 2 hours prior to bedtime  2. Adjustment disorder with anxiety - divalproex (DEPAKOTE ER) 500 MG 24 hr tablet; Take 1,000 mg by mouth at bedtime.; Refill: 5  3. Hyperlipidemia with target LDL less  than 100 Low fat diet - pravastatin (PRAVACHOL) 40 MG tablet; Take 1 tablet (40 mg total) by mouth daily.  Dispense: 90 tablet; Refill: 3 - CMP14+EGFR - NMR, lipoprofile  4. Allergic rhinitis, unspecified allergic rhinitis type - fluticasone (FLONASE) 50 MCG/ACT nasal spray; 1 spray  each nostril BID  Dispense: 16 g; Refill: 6 - Cetirizine HCl (ZYRTEC ALLERGY) 10 MG CAPS; Take 1 capsule (10 mg total) by mouth daily.  Dispense: 30 capsule; Refill: 5  5. IBS (irritable bowel syndrome) - dicyclomine (BENTYL) 10 MG capsule; Take 1 capsule (10 mg total) by mouth 3 (three) times daily before meals.  Dispense: 90 capsule; Refill: 5  6. Right-sided low back pain without sciatica Moist heat Try to avoid no heavy lifting - cyclobenzaprine (FLEXERIL) 10 MG tablet; Take 1 tablet (10 mg total) by mouth 3 (three) times daily as needed for muscle spasms.  Dispense: 30 tablet; Refill: 1    Labs pending Health maintenance reviewed Diet and exercise encouraged Continue all meds Follow up  In 3 months   [place], FNP

## 2014-04-05 LAB — VALPROIC ACID LEVEL: Valproic Acid Lvl: 68 ug/mL (ref 50–100)

## 2014-04-05 LAB — CMP14+EGFR
ALT: 10 IU/L (ref 0–44)
AST: 16 IU/L (ref 0–40)
Albumin/Globulin Ratio: 2 (ref 1.1–2.5)
Albumin: 4.7 g/dL (ref 3.5–5.5)
Alkaline Phosphatase: 37 IU/L — ABNORMAL LOW (ref 39–117)
BUN / CREAT RATIO: 10 (ref 9–20)
BUN: 11 mg/dL (ref 6–24)
Bilirubin Total: <0.2 mg/dL (ref 0.0–1.2)
CO2: 23 mmol/L (ref 18–29)
CREATININE: 1.09 mg/dL (ref 0.76–1.27)
Calcium: 9.5 mg/dL (ref 8.7–10.2)
Chloride: 100 mmol/L (ref 97–108)
GFR calc non Af Amer: 79 mL/min/{1.73_m2} (ref >59)
GFR, EST AFRICAN AMERICAN: 91 mL/min/{1.73_m2} (ref >59)
GLUCOSE: 89 mg/dL (ref 65–99)
Globulin, Total: 2.3 g/dL (ref 1.5–4.5)
POTASSIUM: 4.1 mmol/L (ref 3.5–5.2)
SODIUM: 140 mmol/L (ref 134–144)
Total Protein: 7 g/dL (ref 6.0–8.5)

## 2014-04-05 LAB — NMR, LIPOPROFILE
Cholesterol: 155 mg/dL (ref 100–199)
HDL Cholesterol by NMR: 47 mg/dL (ref >39)
HDL PARTICLE NUMBER: 35.8 umol/L (ref >=30.5)
LDL PARTICLE NUMBER: 1081 nmol/L — AB (ref <1000)
LDL Size: 20.7 nm (ref >20.5)
LDL-C: 88 mg/dL (ref 0–99)
LP-IR Score: 45 (ref <=45)
SMALL LDL PARTICLE NUMBER: 371 nmol/L (ref <=527)
Triglycerides by NMR: 102 mg/dL (ref 0–149)

## 2014-04-07 ENCOUNTER — Encounter: Payer: Self-pay | Admitting: Nurse Practitioner

## 2014-05-05 ENCOUNTER — Other Ambulatory Visit: Payer: Self-pay | Admitting: Nurse Practitioner

## 2014-05-05 DIAGNOSIS — K219 Gastro-esophageal reflux disease without esophagitis: Secondary | ICD-10-CM

## 2014-05-06 MED ORDER — PANTOPRAZOLE SODIUM 40 MG PO TBEC: 40.0000 mg | DELAYED_RELEASE_TABLET | Freq: Every day | ORAL | Status: DC

## 2014-05-06 NOTE — Telephone Encounter (Signed)
Patient wife aware

## 2014-05-06 NOTE — Telephone Encounter (Signed)
protonix rx sent to pharmacy

## 2014-10-28 ENCOUNTER — Other Ambulatory Visit: Payer: Self-pay | Admitting: Nurse Practitioner

## 2014-10-28 NOTE — Telephone Encounter (Signed)
Last seen 03/2014

## 2014-11-22 ENCOUNTER — Other Ambulatory Visit: Payer: Self-pay | Admitting: Nurse Practitioner

## 2014-11-24 NOTE — Telephone Encounter (Signed)
Last seen 04/04/14  MMM

## 2014-11-30 ENCOUNTER — Other Ambulatory Visit: Payer: Self-pay | Admitting: Nurse Practitioner

## 2014-12-02 IMAGING — CR DG CHEST 2V
2 series · 2 of 2 positions shown · non-contrast
Comparison: 05/16/2005

CLINICAL DATA: Cough.  Tobacco his.

EXAM:
CHEST  2 VIEW

[view not recorded (1 of 2)]
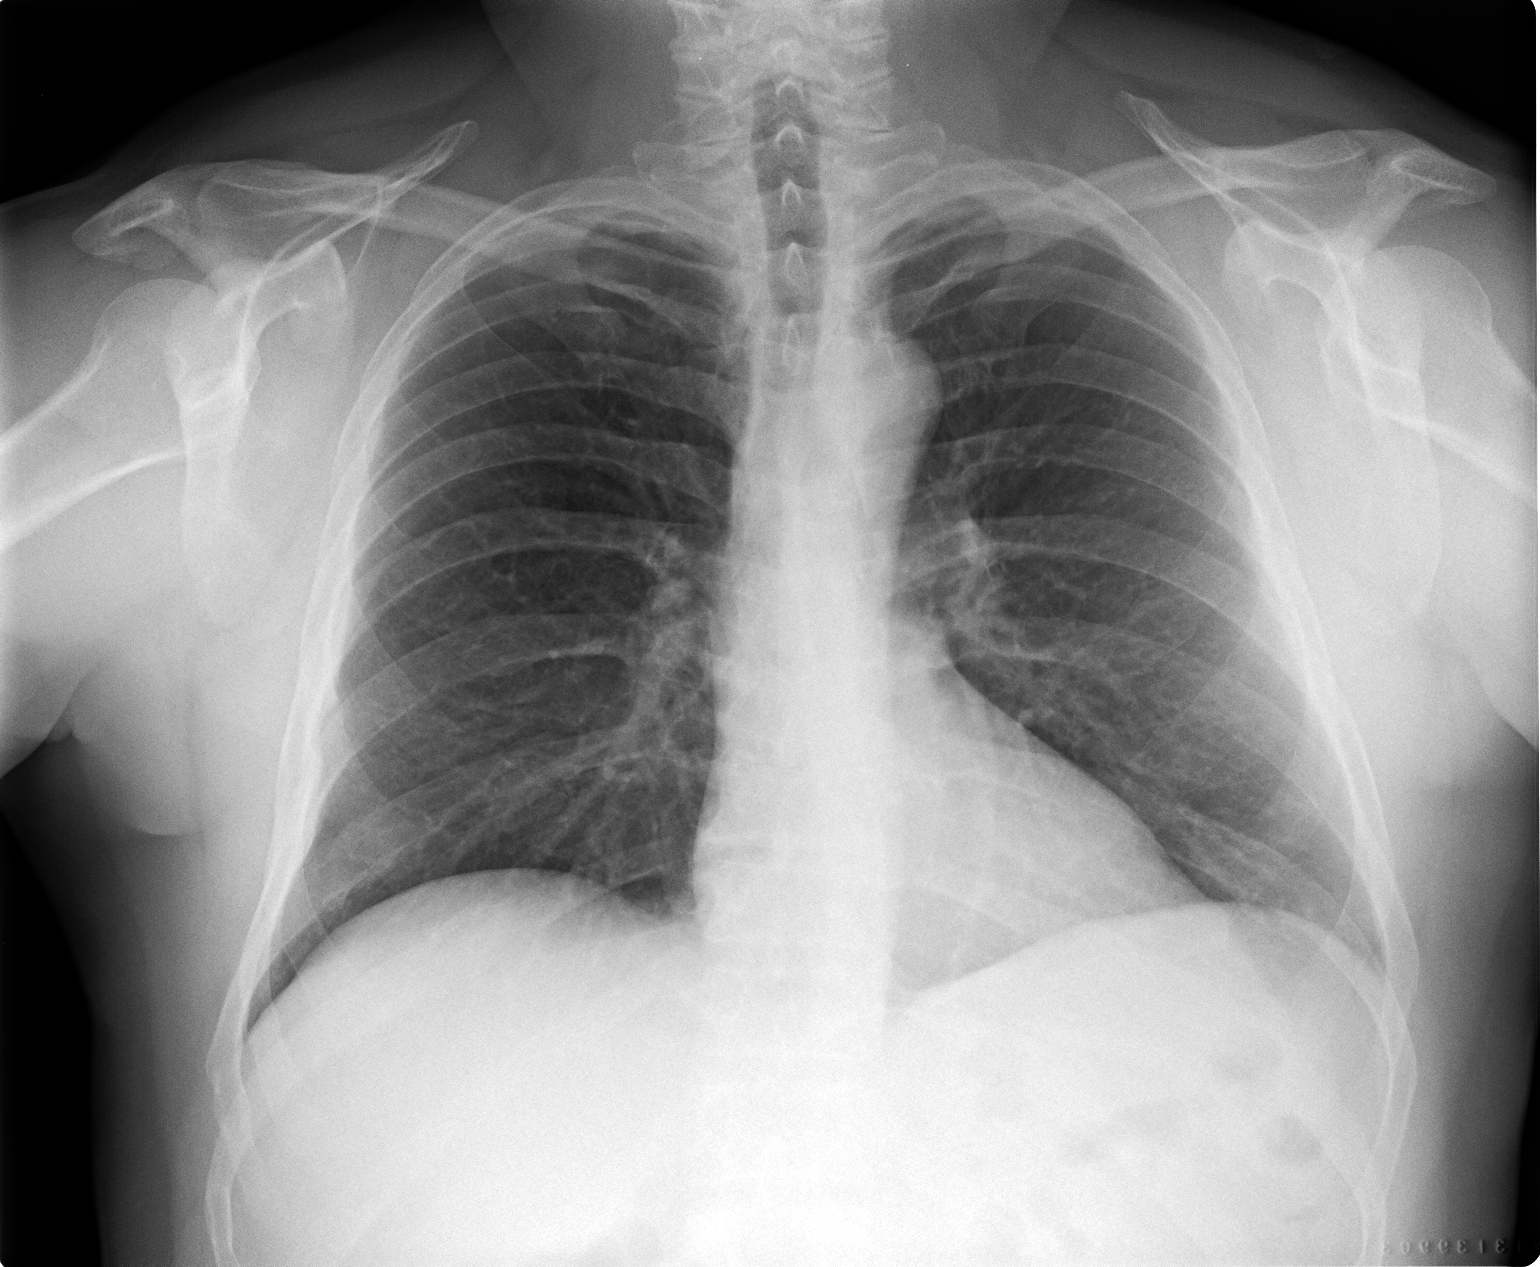

[view not recorded (2 of 2)]
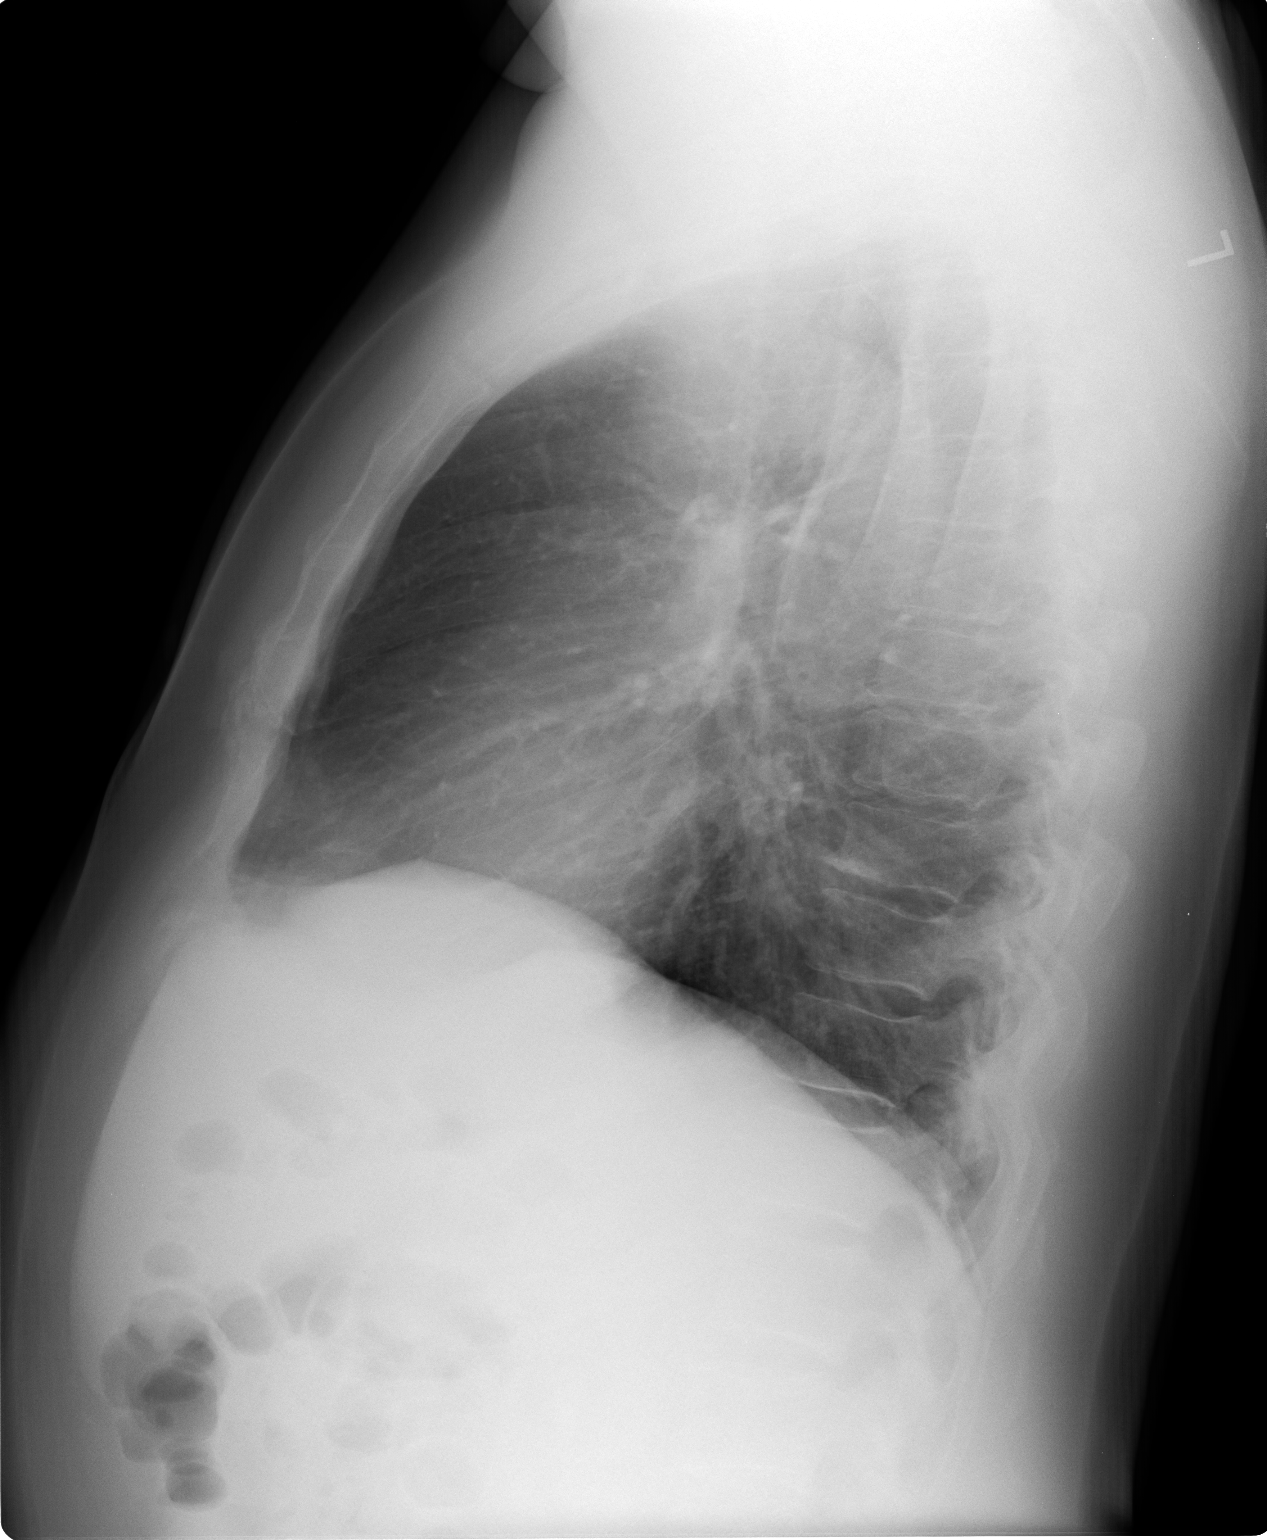

[2 of 2 positions shown; findings below may reference images not displayed]

FINDINGS: Heart, mediastinum and hila are unremarkable. Lungs are clear. No
pleural effusion. No pneumothorax.

Bony thorax is intact.
IMPRESSION: No active cardiopulmonary disease.

## 2014-12-17 ENCOUNTER — Other Ambulatory Visit: Payer: Self-pay

## 2014-12-17 DIAGNOSIS — F4322 Adjustment disorder with anxiety: Secondary | ICD-10-CM

## 2014-12-17 DIAGNOSIS — F329 Major depressive disorder, single episode, unspecified: Secondary | ICD-10-CM

## 2014-12-17 DIAGNOSIS — F32A Depression, unspecified: Secondary | ICD-10-CM

## 2014-12-17 NOTE — Telephone Encounter (Signed)
Last seen 04/04/14  MMM if approved route to nurse to call into [facility]

## 2014-12-19 NOTE — Telephone Encounter (Signed)
ntbs for xanax refill

## 2014-12-25 ENCOUNTER — Other Ambulatory Visit: Payer: Self-pay | Admitting: Nurse Practitioner

## 2014-12-25 NOTE — Telephone Encounter (Signed)
Last seen 04/04/14  MMM 

## 2015-01-01 ENCOUNTER — Other Ambulatory Visit: Payer: Self-pay | Admitting: Nurse Practitioner

## 2015-01-01 NOTE — Telephone Encounter (Signed)
Last refill without being seen 

## 2015-01-01 NOTE — Telephone Encounter (Signed)
Last seen 03/2014 

## 2015-01-01 NOTE — Telephone Encounter (Signed)
All phone numbers are disconnected

## 2015-01-20 ENCOUNTER — Other Ambulatory Visit: Payer: Self-pay | Admitting: Nurse Practitioner

## 2015-02-22 ENCOUNTER — Other Ambulatory Visit: Payer: Self-pay | Admitting: Nurse Practitioner

## 2015-04-21 ENCOUNTER — Other Ambulatory Visit: Payer: Self-pay | Admitting: Nurse Practitioner

## 2015-04-21 NOTE — Telephone Encounter (Signed)
Last seen 03/2014 

## 2015-04-21 NOTE — Telephone Encounter (Signed)
Last refill without being seen 

## 2015-05-06 ENCOUNTER — Ambulatory Visit (INDEPENDENT_AMBULATORY_CARE_PROVIDER_SITE_OTHER): Payer: 59 | Admitting: Nurse Practitioner

## 2015-05-06 ENCOUNTER — Encounter: Payer: Self-pay | Admitting: Nurse Practitioner

## 2015-05-06 VITALS — BP 138/93 | HR 110 | Temp 97.8°F | Ht 69.0 in | Wt 180.0 lb

## 2015-05-06 DIAGNOSIS — E785 Hyperlipidemia, unspecified: Secondary | ICD-10-CM | POA: Diagnosis not present

## 2015-05-06 DIAGNOSIS — R6889 Other general symptoms and signs: Secondary | ICD-10-CM | POA: Diagnosis not present

## 2015-05-06 DIAGNOSIS — K219 Gastro-esophageal reflux disease without esophagitis: Secondary | ICD-10-CM

## 2015-05-06 DIAGNOSIS — K529 Noninfective gastroenteritis and colitis, unspecified: Secondary | ICD-10-CM | POA: Diagnosis not present

## 2015-05-06 LAB — VERITOR FLU A/B WAIVED
INFLUENZA B: NEGATIVE
Influenza A: NEGATIVE

## 2015-05-06 MED ORDER — ONDANSETRON 4 MG PO TBDP: 4.0000 mg | ORAL_TABLET | Freq: Three times a day (TID) | ORAL | Status: DC | PRN

## 2015-05-06 MED ORDER — PANTOPRAZOLE SODIUM 40 MG PO TBEC: DELAYED_RELEASE_TABLET | ORAL | Status: DC

## 2015-05-06 NOTE — Patient Instructions (Signed)

## 2015-05-06 NOTE — Progress Notes (Signed)
   Subjective:    Patient ID: Austin Gonzales, male    DOB: 01/11/1966, 50 y.o.   MRN: [id number]  HPI Patient in today c/o nausea and vomiting- started Monday evening with nausea- has thrown up sevaral times Monday and today- Diarrhea started yesterday. 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  Review of Systems  Constitutional: Negative.   HENT: Negative.   Respiratory: Negative.   Cardiovascular: Negative.   Genitourinary: Negative.   Neurological: Negative.   Psychiatric/Behavioral: Negative.   All other systems reviewed and are negative.      Objective:   Physical Exam  Constitutional: He is oriented to person, place, and time. He appears well-developed and well-nourished. No distress.  Cardiovascular: Normal rate, regular rhythm and normal heart sounds.   Pulmonary/Chest: Effort normal and breath sounds normal.  Abdominal: Soft. Bowel sounds are normal.  Neurological: He is alert and oriented to person, place, and time.  Skin: Skin is warm.  Psychiatric: He has a normal mood and affect. His behavior is normal. Judgment and thought content normal.   BP 138/93 mmHg  Pulse 110  Temp(Src) 97.8 F (36.6 C) (Oral)  Ht 5\' 9"  (1.753 m)  Wt 180 lb (81.647 kg)  BMI 26.57 kg/m2      Assessment & Plan:   1. Flu-like symptoms   2. Noninfectious gastroenteritis, unspecified    Meds ordered this encounter  Medications  . buPROPion (WELLBUTRIN XL) 150 MG 24 hr tablet    Sig: TAKE 1 TABLET BY MOUTH EVERY DAY FOR ANXIETY/DEPRESSION    Refill:  1  . Cetirizine-Pseudoephedrine (ZYRTEC-D PO)    Sig: Take by mouth.  . ondansetron (ZOFRAN ODT) 4 MG disintegrating tablet    Sig: Take 1 tablet (4 mg total) by mouth every 8 (eight) hours as needed for nausea or vomiting.    Dispense:  10  tablet    Refill:  1    Order Specific Question:  Supervising Provider    Answer:  Chipper Herb [[id number]]   First 24 Hours-Clear liquids  popsicles  Jello  gatorade  Sprite Second 24 hours-Add Full liquids ( Liquids you cant see through) Third 24 hours- Bland diet ( foods that are baked or broiled)  *avoiding fried foods and highly spiced foods* During these 3 days  Avoid milk, cheese, ice cream or any other dairy products  Avoid caffeine- REMEMBER Mt. Dew and Mello Yellow contain lots of caffeine You should eat and drink in  Frequent small volumes If no improvement in symptoms or worsen in 2-3 days should RETRUN TO OFFICE or go to ER!    Mary-Margaret Hassell Done, FNP

## 2015-05-07 LAB — CMP14+EGFR
ALK PHOS: 59 IU/L (ref 39–117)
ALT: 12 IU/L (ref 0–44)
AST: 19 IU/L (ref 0–40)
Albumin/Globulin Ratio: 1.7 (ref 1.2–2.2)
Albumin: 4.8 g/dL (ref 3.5–5.5)
BUN/Creatinine Ratio: 7 — ABNORMAL LOW (ref 9–20)
BUN: 7 mg/dL (ref 6–24)
Bilirubin Total: 0.3 mg/dL (ref 0.0–1.2)
CALCIUM: 9.8 mg/dL (ref 8.7–10.2)
CO2: 24 mmol/L (ref 18–29)
CREATININE: 0.94 mg/dL (ref 0.76–1.27)
Chloride: 99 mmol/L (ref 96–106)
GFR calc Af Amer: 108 mL/min/{1.73_m2} (ref >59)
GFR, EST NON AFRICAN AMERICAN: 93 mL/min/{1.73_m2} (ref >59)
GLOBULIN, TOTAL: 2.8 g/dL (ref 1.5–4.5)
GLUCOSE: 107 mg/dL — AB (ref 65–99)
Potassium: 4.2 mmol/L (ref 3.5–5.2)
SODIUM: 141 mmol/L (ref 134–144)
Total Protein: 7.6 g/dL (ref 6.0–8.5)

## 2015-05-07 LAB — LIPID PANEL
CHOL/HDL RATIO: 2.9 ratio (ref 0.0–5.0)
Cholesterol, Total: 250 mg/dL — ABNORMAL HIGH (ref 100–199)
HDL: 87 mg/dL (ref >39)
LDL CALC: 133 mg/dL — AB (ref 0–99)
TRIGLYCERIDES: 152 mg/dL — AB (ref 0–149)
VLDL Cholesterol Cal: 30 mg/dL (ref 5–40)

## 2015-05-11 ENCOUNTER — Telehealth: Payer: Self-pay | Admitting: Nurse Practitioner

## 2015-05-11 NOTE — Telephone Encounter (Signed)
Patient aware of results.

## 2015-05-19 ENCOUNTER — Other Ambulatory Visit: Payer: Self-pay | Admitting: Nurse Practitioner

## 2015-06-08 ENCOUNTER — Ambulatory Visit (INDEPENDENT_AMBULATORY_CARE_PROVIDER_SITE_OTHER): Payer: 59 | Admitting: Family Medicine

## 2015-06-08 ENCOUNTER — Encounter: Payer: Self-pay | Admitting: Family Medicine

## 2015-06-08 VITALS — BP 132/97 | HR 93 | Temp 97.7°F | Ht 69.0 in | Wt 179.2 lb

## 2015-06-08 DIAGNOSIS — S99912A Unspecified injury of left ankle, initial encounter: Secondary | ICD-10-CM

## 2015-06-08 DIAGNOSIS — Z720 Tobacco use: Secondary | ICD-10-CM

## 2015-06-08 DIAGNOSIS — F17200 Nicotine dependence, unspecified, uncomplicated: Secondary | ICD-10-CM | POA: Insufficient documentation

## 2015-06-08 NOTE — Patient Instructions (Signed)
Great to meet you!  Come back if you have any problems.

## 2015-06-08 NOTE — Progress Notes (Signed)
   HPI  Patient presents today seen today for ankle injury.  Patient explains that he stepped in hole with his left foot 8 days ago and had pain in the posterior ankle and Achilles tendon. He had swelling and pain that kept him from going to work for 4 days. He denies any residual pain and states that it has completely resolved.  He denies any fever, chills, sweats, or any other issues at this time.  He has to have a work note releasing him to go back to work without any limitations. He also requests excuse for the 4 days he missed.  Tolerating food and fluids by mouth He is a smoker, considering quitting, not ready  PMH: Smoking status noted ROS: Per HPI  Objective: BP 132/97 mmHg  Pulse 93  Temp(Src) 97.7 F (36.5 C) (Oral)  Ht 5\' 9"  (1.753 m)  Wt 179 lb 3.2 oz (81.285 kg)  BMI 26.45 kg/m2 Gen: NAD, alert, cooperative with exam HEENT: NCAT CV: RRR, good S1/S2, no murmur Resp: CTABL, no wheezes, non-labored Ext: No edema, warm Neuro: Alert and oriented, No gross deficits  MSK:  Left ankle with full range of motion, no tenderness to palpation of any bony or tendon landmarks. No swelling, erythema, or deformity.  Assessment and plan:  # Ankle injury Resolved Note written for work Return to clinic with any worsening symptoms or failure to improve as expected.   # Smoking Recommended quitting, he is contemplative  Laroy Apple, MD [facility] 06/08/2015, 11:11 AM

## 2015-06-17 ENCOUNTER — Other Ambulatory Visit: Payer: Self-pay | Admitting: Nurse Practitioner

## 2015-06-26 ENCOUNTER — Encounter: Payer: Self-pay | Admitting: *Deleted

## 2015-06-27 ENCOUNTER — Other Ambulatory Visit: Payer: Self-pay | Admitting: Nurse Practitioner

## 2015-07-08 ENCOUNTER — Other Ambulatory Visit: Payer: Self-pay | Admitting: Nurse Practitioner

## 2015-07-12 ENCOUNTER — Other Ambulatory Visit: Payer: Self-pay | Admitting: Nurse Practitioner

## 2015-08-05 ENCOUNTER — Ambulatory Visit: Payer: 59 | Admitting: Nurse Practitioner

## 2015-08-07 ENCOUNTER — Ambulatory Visit: Payer: 59 | Admitting: Nurse Practitioner

## 2015-08-10 ENCOUNTER — Encounter: Payer: Self-pay | Admitting: Family

## 2015-08-10 ENCOUNTER — Ambulatory Visit (INDEPENDENT_AMBULATORY_CARE_PROVIDER_SITE_OTHER): Payer: 59 | Admitting: Family

## 2015-08-10 VITALS — BP 131/85 | HR 91 | Temp 97.3°F | Ht 69.0 in | Wt 174.2 lb

## 2015-08-10 DIAGNOSIS — R531 Weakness: Secondary | ICD-10-CM | POA: Diagnosis not present

## 2015-08-10 DIAGNOSIS — E860 Dehydration: Secondary | ICD-10-CM

## 2015-08-10 DIAGNOSIS — Z9889 Other specified postprocedural states: Secondary | ICD-10-CM

## 2015-08-10 DIAGNOSIS — E663 Overweight: Secondary | ICD-10-CM | POA: Diagnosis not present

## 2015-08-10 DIAGNOSIS — Z8719 Personal history of other diseases of the digestive system: Secondary | ICD-10-CM | POA: Diagnosis not present

## 2015-08-10 DIAGNOSIS — Z720 Tobacco use: Secondary | ICD-10-CM

## 2015-08-10 DIAGNOSIS — K589 Irritable bowel syndrome without diarrhea: Secondary | ICD-10-CM

## 2015-08-10 DIAGNOSIS — F17200 Nicotine dependence, unspecified, uncomplicated: Secondary | ICD-10-CM

## 2015-08-10 NOTE — Patient Instructions (Signed)

## 2015-08-10 NOTE — Progress Notes (Signed)
   Subjective:    Patient ID: Austin Gonzales, male    DOB: 12-07-1965, 50 y.o.   MRN: [id number]  Pt presents to the office today for abdominal pain that started 6 months ago. Pt states last week at work while working outside he felt he was "going to pass out". IT has been 81's outside while he was working outside. PT states he drank a lot of water and Gatorade, but "must have sweated most of it out". PT states his work wanted him to get "checked out" before returning to work.   Abdominal Pain This is a chronic problem. The current episode started more than 1 month ago. The onset quality is gradual. The problem occurs intermittently. The problem has been unchanged. The pain is located in the suprapubic region. The pain is at a severity of 8/10. The pain is moderate. The quality of the pain is cramping. The abdominal pain does not radiate. Associated symptoms include diarrhea. Pertinent negatives include no belching, constipation, dysuria, frequency, melena or vomiting. The pain is aggravated by eating (ice cream). Treatments tried: rest. The treatment provided mild relief. His past medical history is significant for abdominal surgery and irritable bowel syndrome.      Review of Systems  Constitutional: Negative.   HENT: Negative.   Respiratory: Negative.   Cardiovascular: Negative.   Gastrointestinal: Positive for abdominal pain and diarrhea. Negative for vomiting, constipation and melena.  Endocrine: Negative.   Genitourinary: Negative for dysuria and frequency.  Musculoskeletal: Negative.   Neurological: Negative.   Hematological: Negative.   Psychiatric/Behavioral: Negative.   All other systems reviewed and are negative.      Objective:   Physical Exam  Constitutional: He is oriented to person, place, and time. He appears well-developed and well-nourished. No distress.  HENT:  Head: Normocephalic.  Eyes: Pupils are equal, round, and reactive to light. Right eye exhibits no  discharge. Left eye exhibits no discharge.  Neck: Normal range of motion. Neck supple. No thyromegaly present.  Cardiovascular: Normal rate, regular rhythm, normal heart sounds and intact distal pulses.   No murmur heard. Pulmonary/Chest: Effort normal and breath sounds normal. No respiratory distress. He has no wheezes.  Abdominal: Soft. Bowel sounds are normal. He exhibits no distension. There is no tenderness.  Musculoskeletal: Normal range of motion. He exhibits no edema or tenderness.  Neurological: He is alert and oriented to person, place, and time.  Skin: Skin is warm and dry. No rash noted. No erythema.  Psychiatric: He has a normal mood and affect. His behavior is normal. Judgment and thought content normal.  Vitals reviewed.     BP 131/85 mmHg  Pulse 91  Temp(Src) 97.3 F (36.3 C) (Oral)  Ht '5\' 9"'$  (1.753 m)  Wt 174 lb 3.2 oz (79.017 kg)  BMI 25.71 kg/m2     Assessment & Plan:  1. IBS (irritable bowel syndrome) - Anemia Profile B - CMP14+EGFR  2. History of umbilical hernia repair - Anemia Profile B - CMP14+EGFR  3. Smoking - Anemia Profile B - CMP14+EGFR  4. Overweight (BMI 25.0-29.9) - Anemia Profile B - CMP14+EGFR  5. Dehydration -Force fluids - Anemia Profile B - CMP14+EGFR  6. Weakness - Anemia Profile B - CMP14+EGFR   Continue all meds Labs pending Health Maintenance reviewed Diet and exercise encouraged RTO as needed and keep chronic follow up appt  Evelina Dun, FNP

## 2015-08-11 LAB — ANEMIA PROFILE B
FERRITIN: 294 ng/mL (ref 30–400)
Folate: >20 ng/mL (ref >3.0)
IRON SATURATION: 46 % (ref 15–55)
Iron: 154 ug/dL (ref 38–169)
Total Iron Binding Capacity: 332 ug/dL (ref 250–450)
UIBC: 178 ug/dL (ref 111–343)
VITAMIN B 12: 548 pg/mL (ref 211–946)

## 2015-08-11 LAB — CMP14+EGFR
A/G RATIO: 1.7 (ref 1.2–2.2)
ALBUMIN: 4.5 g/dL (ref 3.5–5.5)
ALT: 15 IU/L (ref 0–44)
AST: 21 IU/L (ref 0–40)
Alkaline Phosphatase: 60 IU/L (ref 39–117)
BILIRUBIN TOTAL: 0.3 mg/dL (ref 0.0–1.2)
BUN/Creatinine Ratio: 11 (ref 9–20)
BUN: 11 mg/dL (ref 6–24)
CO2: 25 mmol/L (ref 18–29)
Calcium: 9.5 mg/dL (ref 8.7–10.2)
Chloride: 99 mmol/L (ref 96–106)
Creatinine, Ser: 0.99 mg/dL (ref 0.76–1.27)
GFR, EST AFRICAN AMERICAN: 102 mL/min/{1.73_m2} (ref >59)
GFR, EST NON AFRICAN AMERICAN: 88 mL/min/{1.73_m2} (ref >59)
GLUCOSE: 81 mg/dL (ref 65–99)
Globulin, Total: 2.6 g/dL (ref 1.5–4.5)
POTASSIUM: 4 mmol/L (ref 3.5–5.2)
Sodium: 139 mmol/L (ref 134–144)
TOTAL PROTEIN: 7.1 g/dL (ref 6.0–8.5)

## 2015-12-22 ENCOUNTER — Other Ambulatory Visit: Payer: Self-pay | Admitting: Nurse Practitioner

## 2016-02-08 ENCOUNTER — Other Ambulatory Visit: Payer: Self-pay | Admitting: Nurse Practitioner

## 2016-02-08 DIAGNOSIS — J309 Allergic rhinitis, unspecified: Secondary | ICD-10-CM

## 2016-02-19 ENCOUNTER — Other Ambulatory Visit: Payer: Self-pay | Admitting: Nurse Practitioner

## 2016-03-10 ENCOUNTER — Other Ambulatory Visit: Payer: Self-pay | Admitting: Nurse Practitioner

## 2016-03-10 DIAGNOSIS — J309 Allergic rhinitis, unspecified: Secondary | ICD-10-CM

## 2016-03-24 ENCOUNTER — Ambulatory Visit (INDEPENDENT_AMBULATORY_CARE_PROVIDER_SITE_OTHER): Payer: 59 | Admitting: Nurse Practitioner

## 2016-03-24 ENCOUNTER — Encounter: Payer: Self-pay | Admitting: Nurse Practitioner

## 2016-03-24 VITALS — BP 127/79 | HR 88 | Temp 98.5°F | Ht 69.0 in | Wt 173.0 lb

## 2016-03-24 DIAGNOSIS — R110 Nausea: Secondary | ICD-10-CM | POA: Diagnosis not present

## 2016-03-24 DIAGNOSIS — R197 Diarrhea, unspecified: Secondary | ICD-10-CM | POA: Diagnosis not present

## 2016-03-24 MED ORDER — ONDANSETRON HCL 4 MG PO TABS: 4.0000 mg | ORAL_TABLET | Freq: Three times a day (TID) | ORAL | 0 refills | Status: DC | PRN

## 2016-03-24 NOTE — Patient Instructions (Signed)
Diarrhea, Adult Diarrhea is when you have loose and water poop (stool) often. Diarrhea can make you feel weak and cause you to get dehydrated. Dehydration can make you tired and thirsty, make you have a dry mouth, and make it so you pee (urinate) less often. Diarrhea often lasts 2-3 days. However, it can last longer if it is a sign of something more serious. It is important to treat your diarrhea as told by your doctor. Follow these instructions at home: Eating and drinking   Follow these recommendations as told by your doctor:  Take an oral rehydration solution (ORS). This is a drink that is sold at pharmacies and stores.  Drink clear fluids, such as:  Water.  Ice chips.  Diluted fruit juice.  Low-calorie sports drinks.  Eat bland, easy-to-digest foods in small amounts as you are able. These foods include:  Bananas.  Applesauce.  Rice.  Low-fat (lean) meats.  Toast.  Crackers.  Avoid drinking fluids that have a lot of sugar or caffeine in them.  Avoid alcohol.  Avoid spicy or fatty foods. General instructions    Drink enough fluid to keep your pee (urine) clear or pale yellow.  Wash your hands often. If you cannot use soap and water, use hand sanitizer.  Make sure that all people in your home wash their hands well and often.  Take over-the-counter and prescription medicines only as told by your doctor.  Rest at home while you get better.  Watch your condition for any changes.  Take a warm bath to help with any burning or pain from having diarrhea.  Keep all follow-up visits as told by your doctor. This is important. Contact a doctor if:  You have a fever.  Your diarrhea gets worse.  You have new symptoms.  You cannot keep fluids down.  You feel light-headed or dizzy.  You have a headache.  You have muscle cramps. Get help right away if:  You have chest pain.  You feel very weak or you pass out (faint).  You have bloody or black poop or  poop that look like tar.  You have very bad pain, cramping, or bloating in your belly (abdomen).  You have trouble breathing or you are breathing very quickly.  Your heart is beating very quickly.  Your skin feels cold and clammy.  You feel confused.  You have signs of dehydration, such as:  Dark pee, hardly any pee, or no pee.  Cracked lips.  Dry mouth.  Sunken eyes.  Sleepiness.  Weakness. This information is not intended to replace advice given to you by your health care provider. Make sure you discuss any questions you have with your health care provider. Document Released: 06/29/2007 Document Revised: 07/31/2015 Document Reviewed: 09/16/2014 Elsevier Interactive Patient Education  2017 Elsevier Inc.  

## 2016-03-24 NOTE — Progress Notes (Signed)
   Subjective:    Patient ID: Austin Gonzales, male    DOB: [birth year], 51 y.o.   MRN: [id number]  HPI Patient comes in c/o nausea ad vomiting for 3 days with diarrhea that started 2 dayts ago. He last vomited this morning- has kept some gatorade down today. NO fever that he is aware of. Some cramping with diarrhea.   Review of Systems  Constitutional: Positive for appetite change (decreased). Negative for chills and fever.  HENT: Negative.   Respiratory: Negative.   Cardiovascular: Negative.   Gastrointestinal: Positive for diarrhea, nausea and vomiting. Negative for constipation.  Genitourinary: Negative.   Neurological: Negative.   Psychiatric/Behavioral: Negative.        Objective:   Physical Exam  Constitutional: He is oriented to person, place, and time. He appears well-developed and well-nourished. No distress.  Cardiovascular: Normal rate and regular rhythm.   Pulmonary/Chest: Effort normal and breath sounds normal.  Neurological: He is alert and oriented to person, place, and time.  Skin: Skin is warm.  Psychiatric: He has a normal mood and affect. His behavior is normal. Judgment and thought content normal.   BP 127/79   Pulse 88   Temp 98.5 F (36.9 C) (Oral)   Ht '5\' 9"'$  (1.753 m)   Wt 173 lb (78.5 kg)   BMI 25.55 kg/m         Assessment & Plan:  1. Diarrhea, unspecified type Force fluids - Cdiff NAA+O+P+Stool Culture - CBC with Differential/Platelet - CMP14+EGFR  2. Nausea Fluids as can tolerate - ondansetron (ZOFRAN) 4 MG tablet; Take 1 tablet (4 mg total) by mouth every 8 (eight) hours as needed for nausea or vomiting.  Dispense: 20 tablet; Refill: 0  RTO prn  Mary-Margaret Hassell Done, FNP

## 2016-03-25 ENCOUNTER — Encounter: Payer: Self-pay | Admitting: Nurse Practitioner

## 2016-03-25 ENCOUNTER — Other Ambulatory Visit (INDEPENDENT_AMBULATORY_CARE_PROVIDER_SITE_OTHER): Payer: 59

## 2016-03-25 ENCOUNTER — Telehealth: Payer: Self-pay | Admitting: Nurse Practitioner

## 2016-03-25 NOTE — Telephone Encounter (Signed)
Please advise of work note

## 2016-03-26 LAB — CMP14+EGFR
A/G RATIO: 1.6 (ref 1.2–2.2)
ALBUMIN: 4.9 g/dL (ref 3.5–5.5)
ALK PHOS: 49 IU/L (ref 39–117)
ALT: 13 IU/L (ref 0–44)
AST: 22 IU/L (ref 0–40)
BILIRUBIN TOTAL: 0.4 mg/dL (ref 0.0–1.2)
BUN / CREAT RATIO: 8 — AB (ref 9–20)
BUN: 8 mg/dL (ref 6–24)
CO2: 22 mmol/L (ref 18–29)
Calcium: 9.7 mg/dL (ref 8.7–10.2)
Chloride: 98 mmol/L (ref 96–106)
Creatinine, Ser: 1.01 mg/dL (ref 0.76–1.27)
GFR calc non Af Amer: 85 mL/min/{1.73_m2} (ref >59)
GFR, EST AFRICAN AMERICAN: 98 mL/min/{1.73_m2} (ref >59)
GLOBULIN, TOTAL: 3 g/dL (ref 1.5–4.5)
Glucose: 97 mg/dL (ref 65–99)
Potassium: 4.2 mmol/L (ref 3.5–5.2)
SODIUM: 141 mmol/L (ref 134–144)
TOTAL PROTEIN: 7.9 g/dL (ref 6.0–8.5)

## 2016-03-26 LAB — CBC WITH DIFFERENTIAL/PLATELET
BASOS: 0 %
Basophils Absolute: 0 10*3/uL (ref 0.0–0.2)
EOS (ABSOLUTE): 0.1 10*3/uL (ref 0.0–0.4)
EOS: 1 %
HEMATOCRIT: 41.1 % (ref 37.5–51.0)
Hemoglobin: 14.2 g/dL (ref 13.0–17.7)
Immature Grans (Abs): 0 10*3/uL (ref 0.0–0.1)
Immature Granulocytes: 0 %
LYMPHS ABS: 1.9 10*3/uL (ref 0.7–3.1)
Lymphs: 24 %
MCH: 35.1 pg — AB (ref 26.6–33.0)
MCHC: 34.5 g/dL (ref 31.5–35.7)
MCV: 102 fL — AB (ref 79–97)
MONOCYTES: 7 %
Monocytes Absolute: 0.5 10*3/uL (ref 0.1–0.9)
NEUTROS ABS: 5.5 10*3/uL (ref 1.4–7.0)
Neutrophils: 68 %
Platelets: 287 10*3/uL (ref 150–379)
RBC: 4.04 x10E6/uL — ABNORMAL LOW (ref 4.14–5.80)
RDW: 13 % (ref 12.3–15.4)
WBC: 8.1 10*3/uL (ref 3.4–10.8)

## 2016-03-30 LAB — CDIFF NAA+O+P+STOOL CULTURE
CDIFFPCR: NEGATIVE
E COLI SHIGA TOXIN ASSAY: NEGATIVE

## 2016-03-30 LAB — PLEASE NOTE

## 2016-04-09 ENCOUNTER — Other Ambulatory Visit: Payer: Self-pay | Admitting: Nurse Practitioner

## 2016-04-09 DIAGNOSIS — K219 Gastro-esophageal reflux disease without esophagitis: Secondary | ICD-10-CM

## 2016-10-06 ENCOUNTER — Other Ambulatory Visit: Payer: Self-pay | Admitting: Nurse Practitioner

## 2016-10-06 DIAGNOSIS — K219 Gastro-esophageal reflux disease without esophagitis: Secondary | ICD-10-CM

## 2017-02-14 ENCOUNTER — Ambulatory Visit: Payer: 59 | Admitting: Nurse Practitioner

## 2017-02-17 ENCOUNTER — Encounter: Payer: Self-pay | Admitting: Nurse Practitioner

## 2017-02-17 ENCOUNTER — Ambulatory Visit (INDEPENDENT_AMBULATORY_CARE_PROVIDER_SITE_OTHER): Payer: BLUE CROSS/BLUE SHIELD | Admitting: Nurse Practitioner

## 2017-02-17 VITALS — BP 115/68 | HR 89 | Temp 98.1°F | Ht 69.0 in | Wt 178.0 lb

## 2017-02-17 DIAGNOSIS — K219 Gastro-esophageal reflux disease without esophagitis: Secondary | ICD-10-CM | POA: Diagnosis not present

## 2017-02-17 DIAGNOSIS — F1010 Alcohol abuse, uncomplicated: Secondary | ICD-10-CM

## 2017-02-17 DIAGNOSIS — K582 Mixed irritable bowel syndrome: Secondary | ICD-10-CM

## 2017-02-17 DIAGNOSIS — Z125 Encounter for screening for malignant neoplasm of prostate: Secondary | ICD-10-CM

## 2017-02-17 DIAGNOSIS — F17200 Nicotine dependence, unspecified, uncomplicated: Secondary | ICD-10-CM

## 2017-02-17 DIAGNOSIS — E663 Overweight: Secondary | ICD-10-CM | POA: Diagnosis not present

## 2017-02-17 DIAGNOSIS — E782 Mixed hyperlipidemia: Secondary | ICD-10-CM | POA: Diagnosis not present

## 2017-02-17 DIAGNOSIS — F4322 Adjustment disorder with anxiety: Secondary | ICD-10-CM

## 2017-02-17 MED ORDER — PANTOPRAZOLE SODIUM 40 MG PO TBEC
40.0000 mg | DELAYED_RELEASE_TABLET | Freq: Every day | ORAL | 5 refills | Status: DC
Start: 2017-02-17 — End: 2017-09-08

## 2017-02-17 MED ORDER — BUPROPION HCL ER (XL) 300 MG PO TB24: 300.0000 mg | ORAL_TABLET | Freq: Every day | ORAL | 5 refills | Status: DC

## 2017-02-17 MED ORDER — PAROXETINE HCL 30 MG PO TABS: 30.0000 mg | ORAL_TABLET | Freq: Every day | ORAL | 5 refills | Status: DC

## 2017-02-17 MED ORDER — PRAVASTATIN SODIUM 40 MG PO TABS: 40.0000 mg | ORAL_TABLET | Freq: Every day | ORAL | 5 refills | Status: DC

## 2017-02-17 MED ORDER — RANITIDINE HCL 150 MG PO TABS: 150.0000 mg | ORAL_TABLET | Freq: Two times a day (BID) | ORAL | 5 refills | Status: DC

## 2017-02-17 NOTE — Patient Instructions (Signed)
Steps to Quit Smoking Smoking tobacco can be bad for your health. It can also affect almost every organ in your body. Smoking puts you and people around you at risk for many serious long-lasting (chronic) diseases. Quitting smoking is hard, but it is one of the best things that you can do for your health. It is never too late to quit. What are the benefits of quitting smoking? When you quit smoking, you lower your risk for getting serious diseases and conditions. They can include:  Lung cancer or lung disease.  Heart disease.  Stroke.  Heart attack.  Not being able to have children (infertility).  Weak bones (osteoporosis) and broken bones (fractures).  If you have coughing, wheezing, and shortness of breath, those symptoms may get better when you quit. You may also get sick less often. If you are pregnant, quitting smoking can help to lower your chances of having a baby of low birth weight. What can I do to help me quit smoking? Talk with your doctor about what can help you quit smoking. Some things you can do (strategies) include:  Quitting smoking totally, instead of slowly cutting back how much you smoke over a period of time.  Going to in-person counseling. You are more likely to quit if you go to many counseling sessions.  Using resources and support systems, such as: ? Online chats with a counselor. ? Phone quitlines. ? Printed self-help materials. ? Support groups or group counseling. ? Text messaging programs. ? Mobile phone apps or applications.  Taking medicines. Some of these medicines may have nicotine in them. If you are pregnant or breastfeeding, do not take any medicines to quit smoking unless your doctor says it is okay. Talk with your doctor about counseling or other things that can help you.  Talk with your doctor about using more than one strategy at the same time, such as taking medicines while you are also going to in-person counseling. This can help make  quitting easier. What things can I do to make it easier to quit? Quitting smoking might feel very hard at first, but there is a lot that you can do to make it easier. Take these steps:  Talk to your family and friends. Ask them to support and encourage you.  Call phone quitlines, reach out to support groups, or work with a counselor.  Ask people who smoke to not smoke around you.  Avoid places that make you want (trigger) to smoke, such as: ? Bars. ? Parties. ? Smoke-break areas at work.  Spend time with people who do not smoke.  Lower the stress in your life. Stress can make you want to smoke. Try these things to help your stress: ? Getting regular exercise. ? Deep-breathing exercises. ? Yoga. ? Meditating. ? Doing a body scan. To do this, close your eyes, focus on one area of your body at a time from head to toe, and notice which parts of your body are tense. Try to relax the muscles in those areas.  Download or buy apps on your mobile phone or tablet that can help you stick to your quit plan. There are many free apps, such as QuitGuide from the CDC (Centers for Disease Control and Prevention). You can find more support from smokefree.gov and other websites.  This information is not intended to replace advice given to you by your health care provider. Make sure you discuss any questions you have with your health care provider. Document Released: 11/06/2008 Document   Revised: 09/08/2015 Document Reviewed: 05/27/2014 Elsevier Interactive Patient Education  2018 Elsevier Inc.  

## 2017-02-17 NOTE — Addendum Note (Signed)
Addended by: Chevis Pretty on: 02/17/2017 02:45 PM   Modules accepted: Orders

## 2017-02-17 NOTE — Progress Notes (Signed)
Subjective:    Patient ID: Austin Gonzales, male    DOB: [birth year], 52 y.o.   MRN: [id number]  HPI ALEXANDAR WEISENBERGER is here today for follow up of chronic medical problem.  Outpatient Encounter Medications as of 02/17/2017  Medication Sig  . ALPRAZolam (XANAX) 0.5 MG tablet Take 1 tablet (0.5 mg total) by mouth 2 (two) times daily as needed for anxiety. (Patient taking differently: Take 0.5 mg by mouth daily. )  . PARoxetine (PAXIL) 30 MG tablet Take 1 tablet by mouth daily.  Marland Kitchen buPROPion (WELLBUTRIN XL) 300 MG 24 hr tablet Take 1 tablet by mouth daily.  Marland Kitchen dicyclomine (BENTYL) 10 MG capsule TAKE 1 CAPSULE (10 MG TOTAL) BY MOUTH 3 (THREE) TIMES DAILY BEFORE MEALS. (Patient not taking: Reported on 02/17/2017)  . Multiple Vitamin (MULTIVITAMIN) tablet Take 1 tablet by mouth daily.  . ondansetron (ZOFRAN) 4 MG tablet Take 1 tablet (4 mg total) by mouth every 8 (eight) hours as needed for nausea or vomiting. (Patient not taking: Reported on 02/17/2017)  . pantoprazole (PROTONIX) 40 MG tablet Take 1 tablet by mouth daily.  . pravastatin (PRAVACHOL) 40 MG tablet Take 40 mg by mouth daily.  . ranitidine (ZANTAC) 150 MG tablet Take 150 mg by mouth 2 (two) times daily.    1. Gastroesophageal reflux disease without esophagitis  Patient takes zantac and protonix daily  2. Irritable bowel syndrome with both constipation and diarrhea  Switches from diarrhea to constipation but says if he watches diet he does ok. He does take his bentyl daily  3. Mixed hyperlipidemia  Does not watch diet  4. Smoking  Has no desire to stop smoking  5. Overweight (BMI 25.0-29.9)  Does not watch diet  6. Adjustment disorder with anxiety  Takes wellbutrin and paxil daily.syas doing ok.  7. 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  HENT: Negative.   Eyes: Negative for pain.  Respiratory: Negative for shortness of breath.   Cardiovascular: Negative for chest pain, palpitations and leg swelling.  Gastrointestinal: Negative for abdominal pain.  Endocrine: Negative for polydipsia.  Genitourinary: Negative.   Skin: Negative for rash.  Neurological: Negative for dizziness, weakness and headaches.  Hematological: Does not bruise/bleed easily.  Psychiatric/Behavioral: Negative.   All other systems reviewed and are negative.      Objective:   Physical Exam  Constitutional: He is oriented to person, place, and time. He appears well-developed and well-nourished.  HENT:  Head: Normocephalic.  Right Ear: External ear normal.  Left Ear: External ear normal.  Nose: Nose normal.  Mouth/Throat: Oropharynx is clear and moist.  Eyes: EOM are normal. Pupils are equal, round, and reactive to light.  Neck: Normal range of motion. Neck supple. No JVD present. No thyromegaly present.  Cardiovascular: Normal rate, regular rhythm, normal heart sounds and intact distal pulses. Exam reveals no gallop and no friction rub.  No murmur heard. Pulmonary/Chest: Effort normal. No respiratory distress. He has wheezes. He has no rales. He exhibits no tenderness.  Very course breath sounds  Abdominal: Soft. Bowel sounds are normal. He exhibits no mass. There is no tenderness.  Musculoskeletal: Normal range of motion. He exhibits no edema.  Lymphadenopathy:    He has no cervical adenopathy.  Neurological: He is alert and oriented to person, place, and time. No cranial nerve deficit.  Skin: Skin is warm and dry.  Psychiatric: He has a normal mood and affect. His behavior is normal. Judgment and thought content normal.   BP 115/68   Pulse 89   Temp 98.1 F (36.7 C) (Oral)   Ht 5\' 9"  (1.753 m)   Wt 178 lb (80.7 kg)   BMI 26.29 kg/m       Assessment & Plan:  1.  Gastroesophageal reflux disease without esophagitis Avoid spicy foods Do not eat 2 hours prior to bedtime - ranitidine (ZANTAC) 150 MG tablet; Take 1 tablet (150 mg total) by mouth 2 (two) times daily.  Dispense: 30 tablet; Refill: 5 - pantoprazole (PROTONIX) 40 MG tablet; Take 1 tablet (40 mg total) by mouth daily.  Dispense: 30 tablet; Refill: 5  2. Irritable bowel syndrome with both constipation and diarrhea Watch diet  3. Mixed hyperlipidemia Low fat diet - pravastatin (PRAVACHOL) 40 MG tablet; Take 1 tablet (40 mg total) by mouth daily.  Dispense: 30 tablet; Refill: 5  4. Smoking Smoking cessation encourgared  5. Overweight (BMI 25.0-29.9) Discussed diet and exercise for person with BMI >25 Will recheck weight in 3-6 months  6. Adjustment disorder with anxiety Stress management - PARoxetine (PAXIL) 30 MG tablet; Take 1 tablet (30 mg total) by mouth daily.  Dispense: 30 tablet; Refill: 5 - buPROPion (WELLBUTRIN XL) 300 MG 24 hr tablet; Take 1 tablet (300 mg total) by mouth daily.  Dispense: 30 tablet; Refill: 5  7. ABUSE, ALCOHOL, EPISODIC Encouraged to stop drinking    Labs pending Health maintenance reviewed Diet and exercise encouraged Continue all meds Follow up  In 6 months   [place], FNP

## 2017-02-18 LAB — CMP14+EGFR
ALK PHOS: 47 IU/L (ref 39–117)
ALT: 12 IU/L (ref 0–44)
AST: 19 IU/L (ref 0–40)
Albumin/Globulin Ratio: 1.7 (ref 1.2–2.2)
Albumin: 4.3 g/dL (ref 3.5–5.5)
BUN/Creatinine Ratio: 9 (ref 9–20)
BUN: 9 mg/dL (ref 6–24)
Bilirubin Total: <0.2 mg/dL (ref 0.0–1.2)
CALCIUM: 9 mg/dL (ref 8.7–10.2)
CO2: 19 mmol/L — AB (ref 20–29)
CREATININE: 0.97 mg/dL (ref 0.76–1.27)
Chloride: 102 mmol/L (ref 96–106)
GFR calc Af Amer: 103 mL/min/{1.73_m2} (ref >59)
GFR calc non Af Amer: 89 mL/min/{1.73_m2} (ref >59)
GLUCOSE: 91 mg/dL (ref 65–99)
Globulin, Total: 2.5 g/dL (ref 1.5–4.5)
Potassium: 4.1 mmol/L (ref 3.5–5.2)
SODIUM: 137 mmol/L (ref 134–144)
Total Protein: 6.8 g/dL (ref 6.0–8.5)

## 2017-02-18 LAB — PSA, TOTAL AND FREE
PROSTATE SPECIFIC AG, SERUM: 0.5 ng/mL (ref 0.0–4.0)
PSA FREE PCT: 46 %
PSA FREE: 0.23 ng/mL

## 2017-02-18 LAB — LIPID PANEL
Chol/HDL Ratio: 4.6 ratio (ref 0.0–5.0)
Cholesterol, Total: 206 mg/dL — ABNORMAL HIGH (ref 100–199)
HDL: 45 mg/dL (ref >39)
LDL Calculated: 81 mg/dL (ref 0–99)
Triglycerides: 400 mg/dL — ABNORMAL HIGH (ref 0–149)
VLDL Cholesterol Cal: 80 mg/dL — ABNORMAL HIGH (ref 5–40)

## 2017-03-21 ENCOUNTER — Ambulatory Visit (INDEPENDENT_AMBULATORY_CARE_PROVIDER_SITE_OTHER): Payer: Self-pay | Admitting: Family Medicine

## 2017-03-21 ENCOUNTER — Encounter: Payer: Self-pay | Admitting: Family Medicine

## 2017-03-21 VITALS — BP 121/75 | HR 88 | Temp 98.7°F | Ht 69.0 in | Wt 177.0 lb

## 2017-03-21 DIAGNOSIS — J40 Bronchitis, not specified as acute or chronic: Secondary | ICD-10-CM

## 2017-03-21 DIAGNOSIS — K529 Noninfective gastroenteritis and colitis, unspecified: Secondary | ICD-10-CM

## 2017-03-21 MED ORDER — PROMETHAZINE HCL 25 MG PO TABS: 12.5000 mg | ORAL_TABLET | Freq: Three times a day (TID) | ORAL | 0 refills | Status: DC | PRN

## 2017-03-21 MED ORDER — ALBUTEROL SULFATE HFA 108 (90 BASE) MCG/ACT IN AERS
2.0000 | INHALATION_SPRAY | Freq: Four times a day (QID) | RESPIRATORY_TRACT | 0 refills | Status: DC | PRN
Start: 2017-03-21 — End: 2017-11-25

## 2017-03-21 MED ORDER — AZITHROMYCIN 250 MG PO TABS: ORAL_TABLET | ORAL | 0 refills | Status: DC

## 2017-03-21 NOTE — Patient Instructions (Signed)
As we discussed, I would like you to use the albuterol inhaler 2 puffs every 6 hours for the next 2 days.  Then use it as needed as directed on package instructions.  Make sure that you follow-up with Ronnald Collum in 2 weeks for recheck of your lung exam.  You may need to be on a daily inhaler.  I have sent you in a nausea medicine called promethazine.  You may take 1/2-1 tablet every 8 hours as needed for nausea and vomiting.  Please be aware that this can cause sedation.  Do not operate heavy machinery or drive while taking this medicine.  Lastly, I have sent you on azithromycin to take for the next 5 days.  This will cover for any sinus or lung infections.   - Get plenty of rest and drink plenty of fluids. - Try to breathe moist air. Use a cold mist humidifier. - Consume warm fluids (soup or tea) to provide relief for a stuffy nose and to loosen phlegm. - For nasal stuffiness, try saline nasal spray or a Neti Pot. Afrin nasal spray can also be used but this product should not be used longer than 3 days or it will cause rebound nasal stuffiness (worsening nasal congestion). - For sore throat pain relief: suck on throat lozenges, hard candy or popsicles; gargle with warm salt water (1/4 tsp. salt per 8 oz. of water); and eat soft, bland foods. - Eat a well-balanced diet. If you cannot, ensure you are getting enough nutrients by taking a daily multivitamin. - Avoid dairy products, as they can thicken phlegm. - Avoid alcohol, as it impairs your body's immune system.  CONTACT YOUR DOCTOR IF YOU EXPERIENCE ANY OF THE FOLLOWING: - High fever - Ear pain - Sinus-type headache - Unusually severe cold symptoms - Cough that gets worse while other cold symptoms improve - Flare up of any chronic lung problem, such as asthma - Your symptoms persist longer than 2 weeks

## 2017-03-21 NOTE — Progress Notes (Signed)
Subjective: CC: sinus concern PCP: Chevis Pretty, FNP ZJQ:BHALPF C Dearing is a 52 y.o. male presenting to clinic today for:  1. Cold symptoms  Patient reports sinus pressure, nasal congestion, cough, nausea, vomiting that started Thursday.  He reports that vomiting is nonbloody.  It is associated with nonbloody diarrhea.  Hydrating without difficulty.  He notes he actually feels slightly better today than previous days.  He has been taking over-the-counter medications with some improvement.  He reports that his work requires him to be evaluated by a physician prior to returning to work.  Denies hemoptysis, SOB, dizziness, rash, fevers, myalgia, sick contacts, recent travel.  Denies history of COPD or asthma.  Reports a daily tobacco use.    ROS: Per HPI  Allergies  Allergen Reactions  . Amoxicillin   . Latex     blister   Past Medical History:  Diagnosis Date  . Anemia   . Anxiety   . GERD (gastroesophageal reflux disease)   . Hyperlipidemia     Current Outpatient Medications:  .  ALPRAZolam (XANAX) 0.5 MG tablet, Take 1 tablet (0.5 mg total) by mouth 2 (two) times daily as needed for anxiety. (Patient taking differently: Take 0.5 mg by mouth daily. ), Disp: 60 tablet, Rfl: 0 .  buPROPion (WELLBUTRIN XL) 300 MG 24 hr tablet, Take 1 tablet (300 mg total) by mouth daily., Disp: 30 tablet, Rfl: 5 .  dicyclomine (BENTYL) 10 MG capsule, TAKE 1 CAPSULE (10 MG TOTAL) BY MOUTH 3 (THREE) TIMES DAILY BEFORE MEALS. (Patient not taking: Reported on 02/17/2017), Disp: 90 capsule, Rfl: 0 .  Multiple Vitamin (MULTIVITAMIN) tablet, Take 1 tablet by mouth daily., Disp: , Rfl:  .  ondansetron (ZOFRAN) 4 MG tablet, Take 1 tablet (4 mg total) by mouth every 8 (eight) hours as needed for nausea or vomiting. (Patient not taking: Reported on 02/17/2017), Disp: 20 tablet, Rfl: 0 .  pantoprazole (PROTONIX) 40 MG tablet, Take 1 tablet (40 mg total) by mouth daily., Disp: 30 tablet, Rfl: 5 .   PARoxetine (PAXIL) 30 MG tablet, Take 1 tablet (30 mg total) by mouth daily., Disp: 30 tablet, Rfl: 5 .  pravastatin (PRAVACHOL) 40 MG tablet, Take 1 tablet (40 mg total) by mouth daily., Disp: 30 tablet, Rfl: 5 .  ranitidine (ZANTAC) 150 MG tablet, Take 1 tablet (150 mg total) by mouth 2 (two) times daily., Disp: 30 tablet, Rfl: 5 Social History   Socioeconomic History  . Marital status: Married    Spouse name: Not on file  . Number of children: Not on file  . Years of education: Not on file  . Highest education level: Not on file  Social Needs  . Financial resource strain: Not on file  . Food insecurity - worry: Not on file  . Food insecurity - inability: Not on file  . Transportation needs - medical: Not on file  . Transportation needs - non-medical: Not on file  Occupational History  . Not on file  Tobacco Use  . Smoking status: Current Every Day Smoker    Packs/day: 0.50    Types: Cigarettes  . Smokeless tobacco: Never Used  . Tobacco comment: 1 pack a day x 15 yrs  Substance and Sexual Activity  . Alcohol use: No  . Drug use: No  . Sexual activity: Not on file  Other Topics Concern  . Not on file  Social History Narrative  . Not on file   Family History  Problem Relation Age of Onset  .  Cancer Mother        breast  . Heart disease Mother   . Hyperlipidemia Mother   . Atrial fibrillation Mother   . COPD Father   . Asthma Father     Objective: Office vital signs reviewed. BP 121/75   Pulse 88   Temp 98.7 F (37.1 C) (Oral)   Ht 5\' 9"  (1.753 m)   Wt 177 lb (80.3 kg)   BMI 26.14 kg/m   Physical Examination:  General: Awake, alert, tired appearing, No acute distress HEENT: +tenderness to the frontal and maxillary sinuses    Neck: No masses palpated. No lymphadenopathy    Ears: Tympanic membranes intact, normal light reflex, no erythema, no bulging    Eyes: PERRLA, extraocular membranes intact, sclera white    Nose: nasal turbinates moist, clear nasal  discharge    Throat: moist mucus membranes, no erythema, no tonsillar exudate.  Airway is patent Cardio: regular rate and rhythm, S1S2 heard, no murmurs appreciated Pulm: global expiratory wheezes noted. no rhonchi or rales; normal work of breathing on room air GI: soft, non-tender, non-distended, bowel sounds present x4, no hepatomegaly, no splenomegaly, no masses   Assessment/ Plan: 52 y.o. male   1. Bronchitis Global expiratory wheezes were noted.  Per patient's report and record no history of COPD but he is a daily smoker.  I do question if he has undiagnosed COPD.  He had normal work of breathing.  Will treat with inhaler and antibiotic to cover.  Do not think that he is actually in a COPD exacerbation, as he does not feel short of breath and has not appreciated wheezes on his own.  He has no productive cough.  He is afebrile with normal vital signs.  Z-Pak prescribed.  Albuterol inhaler prescribed and instructions for use was reviewed with the patient.  Home care instructions were reviewed with the patient.  Handout was provided.  I recommended that he follow-up in 2 weeks for repeat pulmonary exam.  2. Gastroenteritis No evidence of dehydration on exam.  Promethazine prescribed.  Instructions for use discussed with patient.  Caution sedation.  Work note provided.  Follow-up as directed.  Strict return precautions and reasons for emergent evaluation in the emergency department review with patient.  They voiced understanding and will follow-up as needed.   Meds ordered this encounter  Medications  . azithromycin (ZITHROMAX Z-PAK) 250 MG tablet    Sig: As directed    Dispense:  6 tablet    Refill:  0  . promethazine (PHENERGAN) 25 MG tablet    Sig: Take 0.5-1 tablets (12.5-25 mg total) by mouth every 8 (eight) hours as needed for nausea or vomiting.    Dispense:  12 tablet    Refill:  0  . albuterol (PROVENTIL HFA;VENTOLIN HFA) 108 (90 Base) MCG/ACT inhaler    Sig: Inhale 2 puffs  into the lungs every 6 (six) hours as needed for wheezing or shortness of breath.    Dispense:  1 Inhaler    Refill:  [place], [place] [phone]

## 2017-09-08 ENCOUNTER — Other Ambulatory Visit: Payer: Self-pay | Admitting: Nurse Practitioner

## 2017-09-08 DIAGNOSIS — K219 Gastro-esophageal reflux disease without esophagitis: Secondary | ICD-10-CM

## 2017-09-08 NOTE — Telephone Encounter (Signed)
Last seen 03/21/17  MMM

## 2017-10-20 ENCOUNTER — Other Ambulatory Visit: Payer: Self-pay | Admitting: Family

## 2017-10-20 DIAGNOSIS — K219 Gastro-esophageal reflux disease without esophagitis: Secondary | ICD-10-CM

## 2017-10-20 NOTE — Telephone Encounter (Signed)
Last seen 03/21/17

## 2017-11-25 ENCOUNTER — Other Ambulatory Visit: Payer: Self-pay | Admitting: Family Medicine

## 2018-02-20 ENCOUNTER — Encounter: Payer: Self-pay | Admitting: Family Medicine

## 2018-02-20 ENCOUNTER — Ambulatory Visit: Payer: BLUE CROSS/BLUE SHIELD | Admitting: Family Medicine

## 2018-02-20 VITALS — BP 137/82 | HR 85 | Temp 98.7°F | Ht 69.0 in | Wt 183.0 lb

## 2018-02-20 DIAGNOSIS — J069 Acute upper respiratory infection, unspecified: Secondary | ICD-10-CM | POA: Diagnosis not present

## 2018-02-20 DIAGNOSIS — J029 Acute pharyngitis, unspecified: Secondary | ICD-10-CM

## 2018-02-20 DIAGNOSIS — R490 Dysphonia: Secondary | ICD-10-CM | POA: Diagnosis not present

## 2018-02-20 LAB — CULTURE, GROUP A STREP

## 2018-02-20 LAB — RAPID STREP SCREEN (MED CTR MEBANE ONLY): Strep Gp A Ag, IA W/Reflex: NEGATIVE

## 2018-02-20 MED ORDER — PSEUDOEPH-BROMPHEN-DM 30-2-10 MG/5ML PO SYRP: 5.0000 mL | ORAL_SOLUTION | Freq: Four times a day (QID) | ORAL | 0 refills | Status: DC | PRN

## 2018-02-20 NOTE — Patient Instructions (Signed)

## 2018-02-20 NOTE — Progress Notes (Signed)
    Subjective:     Austin Gonzales is a 53 y.o. male who presents for evaluation of symptoms of a URI. Symptoms include achiness, congestion, coryza, cough described as nonproductive, low grade fever, nasal congestion, post nasal drip and sore throat. Onset of symptoms was 4 days ago, and has been gradually worsening since that time. Treatment to date: antihistamines and cough suppressants.  The following portions of the patient's history were reviewed and updated as appropriate: allergies, current medications, past family history, past medical history, past social history, past surgical history and problem list.  Review of Systems Pertinent items noted in HPI and remainder of comprehensive ROS otherwise negative.   Objective:    BP 137/82   Pulse 85   Temp 98.7 F (37.1 C) (Oral)   Ht 5\' 9"  (1.753 m)   Wt 183 lb (83 kg)   BMI 27.02 kg/m  General appearance: alert, cooperative, appears stated age and mild distress Head: Normocephalic, without obvious abnormality, atraumatic Eyes: negative Ears: abnormal TM right ear - serous middle ear fluid and abnormal TM left ear - serous middle ear fluid Nose: clear and scant discharge, mild congestion, turbinates red, swollen, no sinus tenderness Throat: abnormal findings: moderate oropharyngeal erythema and enlarged tonsils, no exudate Neck: no adenopathy, no carotid bruit, no JVD, supple, symmetrical, trachea midline and thyroid not enlarged, symmetric, no tenderness/mass/nodules Lungs: clear to auscultation bilaterally Heart: regular rate and rhythm, S1, S2 normal, no murmur, click, rub or gallop Skin: Skin color, texture, turgor normal. No rashes or lesions Lymph nodes: Cervical, supraclavicular, and axillary nodes normal. Neurologic: Grossly normal   Assessment:     Austin Gonzales was seen today for uri.  Diagnoses and all orders for this visit:  Sore throat Rapid strep in office negative. Symptomatic care discussed.  -     Rapid Strep  Screen ([facility] ONLY) -     Culture, Group A Strep  Hoarse Symptomatic care discussed.   URI with cough and congestion Symptomatic care discussed. Report any new or worsening symptoms.  -     brompheniramine-pseudoephedrine-DM 30-2-10 MG/5ML syrup; Take 5 mLs by mouth 4 (four) times daily as needed.     Plan:    Discussed diagnosis and treatment of URI. Discussed the importance of avoiding unnecessary antibiotic therapy. Suggested symptomatic OTC remedies. Nasal saline spray for congestion. Follow up as needed. Call in 5 days if symptoms aren't resolving.    The above assessment and management plan was discussed with the patient. The patient verbalized understanding of and has agreed to the management plan. Patient is aware to call the clinic if symptoms fail to improve or worsen. Patient is aware when to return to the clinic for a follow-up visit. Patient educated on when it is appropriate to go to the emergency department.   Monia Pouch, FNP-C [facility] [address] [place], [place] [address] [phone]

## 2018-05-15 ENCOUNTER — Telehealth: Payer: Self-pay | Admitting: Nurse Practitioner

## 2018-05-15 ENCOUNTER — Other Ambulatory Visit: Payer: Self-pay

## 2018-05-15 ENCOUNTER — Encounter: Payer: Self-pay | Admitting: Nurse Practitioner

## 2018-05-15 ENCOUNTER — Ambulatory Visit (INDEPENDENT_AMBULATORY_CARE_PROVIDER_SITE_OTHER): Payer: BLUE CROSS/BLUE SHIELD | Admitting: Nurse Practitioner

## 2018-05-15 DIAGNOSIS — A09 Infectious gastroenteritis and colitis, unspecified: Secondary | ICD-10-CM

## 2018-05-15 DIAGNOSIS — R110 Nausea: Secondary | ICD-10-CM | POA: Diagnosis not present

## 2018-05-15 MED ORDER — ONDANSETRON HCL 4 MG PO TABS: 4.0000 mg | ORAL_TABLET | Freq: Three times a day (TID) | ORAL | 0 refills | Status: DC | PRN

## 2018-05-15 MED ORDER — CIPROFLOXACIN HCL 500 MG PO TABS: 500.0000 mg | ORAL_TABLET | Freq: Two times a day (BID) | ORAL | 0 refills | Status: DC

## 2018-05-15 NOTE — Telephone Encounter (Signed)
Please advise on note content.

## 2018-05-15 NOTE — Progress Notes (Signed)
Patient ID: LIVIO LEDWITH, male   DOB: Sep 08, 1965, 53 y.o.   MRN: [id number]    Virtual Visit via telephone Note  I connected with Andry C Desrosier on 05/15/18 at 8:45 AM by telephone and verified that I am speaking with the correct person using two identifiers. Fue C Pritts is currently located at home and no one is currently with her during visit. The provider, Mary-Margaret Hassell Done, FNP is located in their office at time of visit.  I discussed the limitations, risks, security and privacy concerns of performing an evaluation and management service by telephone and the availability of in person appointments. I also discussed with the patient that there may be a patient responsible charge related to this service. The patient expressed understanding and agreed to proceed.   History and Present Illness:   Chief Complaint: Diarrhea   HPI Patient calls in today c/o diarrhea that started on Friday. He has been talking imodium OTC with no relief. He says that he went over 10 x yesterday despite the imodium. He denies any pus or mucus appearing in stool. He has been nauseated for 2 days but denies any vomiting.     Review of Systems  Constitutional: Negative for chills and fever.       Decrease appetite.  HENT: Negative.   Respiratory: Negative.   Cardiovascular: Negative.   Skin: Negative.   Neurological: Negative.   Psychiatric/Behavioral: Negative.   All other systems reviewed and are negative.    Observations/Objective: Alert and oriented  No distress noted  Assessment and Plan: Josephine C Rampey in today with chief complaint of Diarrhea   1. Diarrhea of infectious origin force fluids - ciprofloxacin (CIPRO) 500 MG tablet; Take 1 tablet (500 mg total) by mouth 2 (two) times daily.  Dispense: 20 tablet; Refill: 0  2. Nausea Bland diet- increase as ytoelerated - ondansetron (ZOFRAN) 4 MG tablet; Take 1 tablet (4 mg total) by mouth every 8 (eight) hours as needed  for nausea or vomiting.  Dispense: 20 tablet; Refill: 0    Follow Up Instructions: prn     I discussed the assessment and treatment plan with the patient. The patient was provided an opportunity to ask questions and all were answered. The patient agreed with the plan and demonstrated an understanding of the instructions.   The patient was advised to call back or seek an in-person evaluation if the symptoms worsen or if the condition fails to improve as anticipated.  The above assessment and management plan was discussed with the patient. The patient verbalized understanding of and has agreed to the management plan. Patient is aware to call the clinic if symptoms persist or worsen. Patient is aware when to return to the clinic for a follow-up visit. Patient educated on when it is appropriate to go to the emergency department.    I provided 10 minutes of non-face-to-face time during this encounter.    Mary-Margaret Hassell Done, FNP

## 2018-05-16 NOTE — Telephone Encounter (Signed)
Also fax to the number [phone]

## 2018-05-16 NOTE — Telephone Encounter (Signed)
NEEDS DONE BY 5PM ASAP third callw

## 2018-05-17 ENCOUNTER — Encounter: Payer: Self-pay | Admitting: Nurse Practitioner

## 2018-05-17 ENCOUNTER — Ambulatory Visit (INDEPENDENT_AMBULATORY_CARE_PROVIDER_SITE_OTHER): Payer: BLUE CROSS/BLUE SHIELD | Admitting: Nurse Practitioner

## 2018-05-17 ENCOUNTER — Other Ambulatory Visit: Payer: Self-pay

## 2018-05-17 DIAGNOSIS — Z8719 Personal history of other diseases of the digestive system: Secondary | ICD-10-CM | POA: Diagnosis not present

## 2018-05-17 DIAGNOSIS — Z8619 Personal history of other infectious and parasitic diseases: Secondary | ICD-10-CM

## 2018-05-17 NOTE — Progress Notes (Signed)
Patient ID: Austin Gonzales, male   DOB: 1965-03-13, 53 y.o.   MRN: [id number]    Virtual Visit via telephone Note  I connected with Austin Gonzales on 05/17/18 at 3:50PM by telephone and verified that I am speaking with the correct person using two identifiers. Austin Gonzales is currently located at home  and non one is currently with her during visit. The provider, Mary-Margaret Hassell Done, FNP is located in their office at time of visit.  I discussed the limitations, risks, security and privacy concerns of performing an evaluation and management service by telephone and the availability of in person appointments. I also discussed with the patient that there may be a patient responsible charge related to this service. The patient expressed understanding and agreed to proceed.   History and Present Illness:   Chief Complaint: Diarrhea   HPI Patient had a telephone visit on 05/15/18 with diarrhea. He was given cipro and is better. He denies anymore nausea or vomiting. Still has occasional diarrhea. He needs not eto go back to work.     Review of Systems  Constitutional: Negative for diaphoresis and weight loss.  Eyes: Negative for blurred vision, double vision and pain.  Respiratory: Negative for shortness of breath.   Cardiovascular: Negative for chest pain, palpitations, orthopnea and leg swelling.  Gastrointestinal: Negative for abdominal pain.  Skin: Negative for rash.  Neurological: Negative for dizziness, sensory change, loss of consciousness, weakness and headaches.  Endo/Heme/Allergies: Negative for polydipsia. Does not bruise/bleed easily.  Psychiatric/Behavioral: Negative for memory loss. The patient does not have insomnia.   All other systems reviewed and are negative.    Observations/Objective: Alert and oriented No distress noted  Assessment and Plan: Austin Gonzales in today with chief complaint of Diarrhea   1. Hx of infectious diarrhea Force fluids  Note given to go back to work   Follow Up Instructions: . prn   I discussed the assessment and treatment plan with the patient. The patient was provided an opportunity to ask questions and all were answered. The patient agreed with the plan and demonstrated an understanding of the instructions.   The patient was advised to call back or seek an in-person evaluation if the symptoms worsen or if the condition fails to improve as anticipated.  The above assessment and management plan was discussed with the patient. The patient verbalized understanding of and has agreed to the management plan. Patient is aware to call the clinic if symptoms persist or worsen. Patient is aware when to return to the clinic for a follow-up visit. Patient educated on when it is appropriate to go to the emergency department.    I provided 7 minutes of non-face-to-face time during this encounter.    Mary-Margaret Hassell Done, FNP

## 2018-05-18 NOTE — Telephone Encounter (Signed)
Aware.  Note was faxed and  copy is ready at front.

## 2018-05-18 NOTE — Telephone Encounter (Signed)
Patient states his stomached acted up again today. Patient asking if mmm will write him another note out of work till Monday. Please advise.

## 2018-05-18 NOTE — Telephone Encounter (Signed)
Note ready for pick up

## 2018-05-29 ENCOUNTER — Encounter: Payer: Self-pay | Admitting: Nurse Practitioner

## 2018-05-29 ENCOUNTER — Telehealth: Payer: Self-pay | Admitting: Nurse Practitioner

## 2018-05-29 NOTE — Telephone Encounter (Signed)
Note is in chart. He can either come and pick up or print it off of my chart- patient aware

## 2018-05-29 NOTE — Telephone Encounter (Signed)
MMM, is this ok?

## 2018-06-12 ENCOUNTER — Encounter: Payer: Self-pay | Admitting: Nurse Practitioner

## 2018-06-12 ENCOUNTER — Other Ambulatory Visit: Payer: Self-pay

## 2018-06-12 ENCOUNTER — Ambulatory Visit (INDEPENDENT_AMBULATORY_CARE_PROVIDER_SITE_OTHER): Payer: BLUE CROSS/BLUE SHIELD | Admitting: Nurse Practitioner

## 2018-06-12 DIAGNOSIS — F1020 Alcohol dependence, uncomplicated: Secondary | ICD-10-CM

## 2018-06-12 DIAGNOSIS — F411 Generalized anxiety disorder: Secondary | ICD-10-CM | POA: Diagnosis not present

## 2018-06-12 DIAGNOSIS — F331 Major depressive disorder, recurrent, moderate: Secondary | ICD-10-CM | POA: Diagnosis not present

## 2018-06-12 NOTE — Progress Notes (Signed)
   Virtual Visit via telephone Note  I connected with Gerell C Peppers on 06/12/18 at 9: 20 AM by telephone and verified that I am speaking with the correct person using two identifiers. Woodfin C Trimble is currently located at home and no one is currently with her during visit. The provider, Mary-Margaret Hassell Done, FNP is located in their office at time of visit.  I discussed the limitations, risks, security and privacy concerns of performing an evaluation and management service by telephone and the availability of in person appointments. I also discussed with the patient that there may be a patient responsible charge related to this service. The patient expressed understanding and agreed to proceed.   History and Present Illness:  Patient calls in today staying that he has been out of work due to alcohol consumption. He got a DWI several months ago and that has made him down. He has been drinking everyday. He needs note for work. Has an appointment with counselor at 1030 this morning.   ROS   Observations/Objective: *alert and oriented Sounds anxious  Assessment and Plan: Avenir C Heber in today with chief complaint of Medical Management of Chronic Issues   1. Alcoholic ([place]) Encouraged not to miss counseling appointment He will call back ifhe needs my help with anything  Follow Up Instructions: 3 months   I discussed the assessment and treatment plan with the patient. The patient was provided an opportunity to ask questions and all were answered. The patient agreed with the plan and demonstrated an understanding of the instructions.   The patient was advised to call back or seek an in-person evaluation if the symptoms worsen or if the condition fails to improve as anticipated.  The above assessment and management plan was discussed with the patient. The patient verbalized understanding of and has agreed to the management plan. Patient is aware to call the clinic if symptoms  persist or worsen. Patient is aware when to return to the clinic for a follow-up visit. Patient educated on when it is appropriate to go to the emergency department.   Time call ended:  9:35  I provided 15 minutes of non-face-to-face time during this encounter.    Mary-Margaret Hassell Done, FNP

## 2018-06-25 DIAGNOSIS — W57XXXA Bitten or stung by nonvenomous insect and other nonvenomous arthropods, initial encounter: Secondary | ICD-10-CM | POA: Diagnosis not present

## 2018-06-25 DIAGNOSIS — Z6825 Body mass index (BMI) 25.0-25.9, adult: Secondary | ICD-10-CM | POA: Diagnosis not present

## 2018-06-25 DIAGNOSIS — R21 Rash and other nonspecific skin eruption: Secondary | ICD-10-CM | POA: Diagnosis not present

## 2018-06-29 ENCOUNTER — Telehealth: Payer: Self-pay | Admitting: Nurse Practitioner

## 2018-07-04 ENCOUNTER — Ambulatory Visit (INDEPENDENT_AMBULATORY_CARE_PROVIDER_SITE_OTHER): Payer: BC Managed Care – PPO | Admitting: Family Medicine

## 2018-07-04 ENCOUNTER — Encounter: Payer: Self-pay | Admitting: Family Medicine

## 2018-07-04 ENCOUNTER — Other Ambulatory Visit: Payer: Self-pay

## 2018-07-04 DIAGNOSIS — K582 Mixed irritable bowel syndrome: Secondary | ICD-10-CM

## 2018-07-04 DIAGNOSIS — R110 Nausea: Secondary | ICD-10-CM | POA: Diagnosis not present

## 2018-07-04 MED ORDER — DICYCLOMINE HCL 10 MG PO CAPS: ORAL_CAPSULE | ORAL | 0 refills | Status: DC

## 2018-07-04 MED ORDER — ONDANSETRON HCL 4 MG PO TABS: 4.0000 mg | ORAL_TABLET | Freq: Three times a day (TID) | ORAL | 0 refills | Status: DC | PRN

## 2018-07-04 NOTE — Progress Notes (Signed)
Virtual Visit via telephone Note Due to COVID-19, visit is conducted virtually and was requested by patient. This visit type was conducted due to national recommendations for restrictions regarding the COVID-19 Pandemic (e.g. social distancing) in an effort to limit this patient's exposure and mitigate transmission in our community. All issues noted in this document were discussed and addressed.  A physical exam was not performed with this format.   I connected with Austin Gonzales on 07/04/18 at 1520 by telephone and verified that I am speaking with the correct person using two identifiers. Austin Gonzales is currently located at home and no one is currently with them during visit. The provider, Monia Pouch, FNP is located in their office at time of visit.  I discussed the limitations, risks, security and privacy concerns of performing an evaluation and management service by telephone and the availability of in person appointments. I also discussed with the patient that there may be a patient responsible charge related to this service. The patient expressed understanding and agreed to proceed.  Subjective:  Patient ID: Austin Gonzales, male    DOB: November 03, 1965, 53 y.o.   MRN: [id number]  Chief Complaint:  Abdominal Pain   HPI: Austin Gonzales is a 53 y.o. male presenting on 07/04/2018 for Abdominal Pain   Pt states he has IBS with diarrhea and constipation. Pt states he woke up this morning with stomach cramping, diarrhea, and nausea. Pt states he had to miss work due to the pain and diarrhea. Pt states he is out of his Bentyl and Zofran. Pt states he is feeling slightly better but still has diarrhea. Pt denies fever, chills, hematochezia, or melena. No weakness, chills, confusion, or dizziness.   Abdominal Pain  This is a recurrent problem. The current episode started today. The onset quality is gradual. The problem occurs intermittently. The problem has been waxing and waning. The  pain is located in the generalized abdominal region. The pain is at a severity of 3/10. The pain is mild. The quality of the pain is cramping and colicky. Associated symptoms include diarrhea and nausea. Pertinent negatives include no anorexia, arthralgias, belching, constipation, dysuria, fever, flatus, frequency, headaches, hematochezia, hematuria, melena, myalgias, vomiting or weight loss. The pain is aggravated by eating. The pain is relieved by bowel movements. He has tried nothing for the symptoms. His past medical history is significant for irritable bowel syndrome.     Relevant past medical, surgical, family, and social history reviewed and updated as indicated.  Allergies and medications reviewed and updated.   Past Medical History:  Diagnosis Date  . Anemia   . Anxiety   . GERD (gastroesophageal reflux disease)   . Hyperlipidemia     Past Surgical History:  Procedure Laterality Date  . COLONOSCOPY N/A 06/26/2013   Procedure: COLONOSCOPY;  Surgeon: Rogene Houston, MD;  Location: [facility];  Service: Endoscopy;  Laterality: N/A;  1200  . FRACTURE SURGERY Right    Jaw    Social History   Socioeconomic History  . Marital status: Married    Spouse name: Not on file  . Number of children: Not on file  . Years of education: Not on file  . Highest education level: Not on file  Occupational History  . Not on file  Social Needs  . Financial resource strain: Not on file  . Food insecurity:    Worry: Not on file    Inability: Not on file  . Transportation needs:  Medical: Not on file    Non-medical: Not on file  Tobacco Use  . Smoking status: Current Every Day Smoker    Packs/day: 0.50    Types: Cigarettes  . Smokeless tobacco: Never Used  . Tobacco comment: 1 pack a day x 15 yrs  Substance and Sexual Activity  . Alcohol use: No  . Drug use: No  . Sexual activity: Not on file  Lifestyle  . Physical activity:    Days per week: Not on file    Minutes per  session: Not on file  . Stress: Not on file  Relationships  . Social connections:    Talks on phone: Not on file    Gets together: Not on file    Attends religious service: Not on file    Active member of club or organization: Not on file    Attends meetings of clubs or organizations: Not on file    Relationship status: Not on file  . Intimate partner violence:    Fear of current or ex partner: Not on file    Emotionally abused: Not on file    Physically abused: Not on file    Forced sexual activity: Not on file  Other Topics Concern  . Not on file  Social History Narrative  . Not on file    Outpatient Encounter Medications as of 07/04/2018  Medication Sig  . ALPRAZolam (XANAX) 0.5 MG tablet Take 1 tablet (0.5 mg total) by mouth 2 (two) times daily as needed for anxiety.  . brompheniramine-pseudoephedrine-DM 30-2-10 MG/5ML syrup Take 5 mLs by mouth 4 (four) times daily as needed.  Marland Kitchen buPROPion (WELLBUTRIN XL) 300 MG 24 hr tablet Take 1 tablet (300 mg total) by mouth daily. (Patient not taking: Reported on 02/20/2018)  . ciprofloxacin (CIPRO) 500 MG tablet Take 1 tablet (500 mg total) by mouth 2 (two) times daily.  Marland Kitchen dicyclomine (BENTYL) 10 MG capsule TAKE 1 CAPSULE (10 MG TOTAL) BY MOUTH 3 (THREE) TIMES DAILY BEFORE MEALS.  . Multiple Vitamin (MULTIVITAMIN) tablet Take 1 tablet by mouth daily.  . ondansetron (ZOFRAN) 4 MG tablet Take 1 tablet (4 mg total) by mouth every 8 (eight) hours as needed for nausea or vomiting.  . pantoprazole (PROTONIX) 40 MG tablet TAKE 1 TABLET BY MOUTH ONCE DAILY  . PARoxetine (PAXIL) 30 MG tablet Take 1 tablet (30 mg total) by mouth daily. (Patient not taking: Reported on 02/20/2018)  . pravastatin (PRAVACHOL) 40 MG tablet Take 1 tablet (40 mg total) by mouth daily. (Patient not taking: Reported on 02/20/2018)  . promethazine (PHENERGAN) 25 MG tablet Take 0.5-1 tablets (12.5-25 mg total) by mouth every 8 (eight) hours as needed for nausea or vomiting.  (Patient not taking: Reported on 02/20/2018)  . ranitidine (ZANTAC) 150 MG tablet Take 1 tablet (150 mg total) by mouth 2 (two) times daily. (Patient not taking: Reported on 02/20/2018)  . VENTOLIN HFA 108 (90 Base) MCG/ACT inhaler Inhale 2 puffs into the lungs every 6 (six) hours as needed for wheezing or shortness of breath. (Needs to be seen before next refill) (Patient not taking: Reported on 02/20/2018)  . [DISCONTINUED] dicyclomine (BENTYL) 10 MG capsule TAKE 1 CAPSULE (10 MG TOTAL) BY MOUTH 3 (THREE) TIMES DAILY BEFORE MEALS. (Patient not taking: Reported on 02/20/2018)  . [DISCONTINUED] ondansetron (ZOFRAN) 4 MG tablet Take 1 tablet (4 mg total) by mouth every 8 (eight) hours as needed for nausea or vomiting.   No facility-administered encounter medications on file as of 07/04/2018.  Allergies  Allergen Reactions  . Amoxicillin   . Latex     blister    Review of Systems  Constitutional: Negative for activity change, appetite change, chills, diaphoresis, fatigue, fever, unexpected weight change and weight loss.  Respiratory: Negative for cough and shortness of breath.   Cardiovascular: Negative for chest pain, palpitations and leg swelling.  Gastrointestinal: Positive for abdominal pain, diarrhea and nausea. Negative for anorexia, constipation, flatus, hematochezia, melena and vomiting.  Genitourinary: Negative for dysuria, frequency and hematuria.  Musculoskeletal: Negative for arthralgias and myalgias.  Neurological: Negative for weakness and headaches.  Psychiatric/Behavioral: Negative for confusion.  All other systems reviewed and are negative.        Observations/Objective: No vital signs or physical exam, this was a telephone or virtual health encounter.  Pt alert and oriented, answers all questions appropriately, and able to speak in full sentences.    Assessment and Plan: Austin Gonzales was seen today for abdominal pain.  Diagnoses and all orders for this visit:   Irritable bowel syndrome with both constipation and diarrhea Recurrent IBS mixed type. Pt needs refill on Bentyl. No red flags. Will refill medications. Report any new or worsening symptoms.  -     dicyclomine (BENTYL) 10 MG capsule; TAKE 1 CAPSULE (10 MG TOTAL) BY MOUTH 3 (THREE) TIMES DAILY BEFORE MEALS.  Nausea -     ondansetron (ZOFRAN) 4 MG tablet; Take 1 tablet (4 mg total) by mouth every 8 (eight) hours as needed for nausea or vomiting.     Follow Up Instructions: Return if symptoms worsen or fail to improve.    I discussed the assessment and treatment plan with the patient. The patient was provided an opportunity to ask questions and all were answered. The patient agreed with the plan and demonstrated an understanding of the instructions.   The patient was advised to call back or seek an in-person evaluation if the symptoms worsen or if the condition fails to improve as anticipated.  The above assessment and management plan was discussed with the patient. The patient verbalized understanding of and has agreed to the management plan. Patient is aware to call the clinic if symptoms persist or worsen. Patient is aware when to return to the clinic for a follow-up visit. Patient educated on when it is appropriate to go to the emergency department.    I provided 15 minutes of non-face-to-face time during this encounter. The call started at 1520. The call ended at 1535. The other time was used for coordination of care.    Monia Pouch, FNP-C [facility] [address] [place], [place] [address] [phone]

## 2020-03-12 DIAGNOSIS — F411 Generalized anxiety disorder: Secondary | ICD-10-CM | POA: Diagnosis not present

## 2020-03-12 DIAGNOSIS — F331 Major depressive disorder, recurrent, moderate: Secondary | ICD-10-CM | POA: Diagnosis not present

## 2020-05-21 DIAGNOSIS — F331 Major depressive disorder, recurrent, moderate: Secondary | ICD-10-CM | POA: Diagnosis not present

## 2020-05-21 DIAGNOSIS — F411 Generalized anxiety disorder: Secondary | ICD-10-CM | POA: Diagnosis not present

## 2020-06-04 DIAGNOSIS — F331 Major depressive disorder, recurrent, moderate: Secondary | ICD-10-CM | POA: Diagnosis not present

## 2020-06-04 DIAGNOSIS — F411 Generalized anxiety disorder: Secondary | ICD-10-CM | POA: Diagnosis not present

## 2020-06-08 DIAGNOSIS — F411 Generalized anxiety disorder: Secondary | ICD-10-CM | POA: Diagnosis not present

## 2020-06-08 DIAGNOSIS — F331 Major depressive disorder, recurrent, moderate: Secondary | ICD-10-CM | POA: Diagnosis not present

## 2020-07-28 ENCOUNTER — Encounter: Payer: BC Managed Care – PPO | Admitting: Physician Assistant

## 2020-07-30 ENCOUNTER — Encounter: Payer: Self-pay | Admitting: Nurse Practitioner

## 2020-08-04 ENCOUNTER — Encounter: Payer: Self-pay | Admitting: Nurse Practitioner

## 2020-08-04 ENCOUNTER — Other Ambulatory Visit: Payer: Self-pay

## 2020-08-04 ENCOUNTER — Ambulatory Visit (INDEPENDENT_AMBULATORY_CARE_PROVIDER_SITE_OTHER): Payer: BC Managed Care – PPO | Admitting: Nurse Practitioner

## 2020-08-04 VITALS — BP 117/73 | HR 93 | Temp 98.6°F | Resp 20 | Ht 69.0 in | Wt 161.0 lb

## 2020-08-04 DIAGNOSIS — F1010 Alcohol abuse, uncomplicated: Secondary | ICD-10-CM

## 2020-08-04 DIAGNOSIS — Z0000 Encounter for general adult medical examination without abnormal findings: Secondary | ICD-10-CM

## 2020-08-04 DIAGNOSIS — K219 Gastro-esophageal reflux disease without esophagitis: Secondary | ICD-10-CM | POA: Diagnosis not present

## 2020-08-04 DIAGNOSIS — Z125 Encounter for screening for malignant neoplasm of prostate: Secondary | ICD-10-CM

## 2020-08-04 DIAGNOSIS — Z0001 Encounter for general adult medical examination with abnormal findings: Secondary | ICD-10-CM

## 2020-08-04 DIAGNOSIS — Z6823 Body mass index (BMI) 23.0-23.9, adult: Secondary | ICD-10-CM

## 2020-08-04 DIAGNOSIS — F4322 Adjustment disorder with anxiety: Secondary | ICD-10-CM | POA: Diagnosis not present

## 2020-08-04 DIAGNOSIS — E782 Mixed hyperlipidemia: Secondary | ICD-10-CM | POA: Diagnosis not present

## 2020-08-04 DIAGNOSIS — F17200 Nicotine dependence, unspecified, uncomplicated: Secondary | ICD-10-CM

## 2020-08-04 DIAGNOSIS — K582 Mixed irritable bowel syndrome: Secondary | ICD-10-CM

## 2020-08-04 MED ORDER — PANTOPRAZOLE SODIUM 40 MG PO TBEC: 40.0000 mg | DELAYED_RELEASE_TABLET | Freq: Every day | ORAL | 3 refills | Status: DC

## 2020-08-04 NOTE — Progress Notes (Signed)
Subjective:    Patient ID: Austin Gonzales, male    DOB: May 10, 1965, 55 y.o.   MRN: [id number]    Chief Complaint: Annual Exam    HPI:  1. Annual physical exam  2. Mixed hyperlipidemia Does not watch diet. Does  no dedicated exercise.  Lab Results  Component Value Date   CHOL 206 (H) 02/17/2017   HDL 45 02/17/2017   LDLCALC 81 02/17/2017   TRIG 400 (H) 02/17/2017   CHOLHDL 4.6 02/17/2017     3. Adjustment disorder with anxiety Is on paxil and is doing well. No medication side effects. GAD 7 : Generalized Anxiety Score 08/04/2020  Nervous, Anxious, on Edge 1  Control/stop worrying 1  Worry too much - different things 1  Trouble relaxing 1  Restless 1  Easily annoyed or irritable 1  Afraid - awful might happen 0  Total GAD 7 Score 6  Anxiety Difficulty Somewhat difficult      4. Gastroesophageal reflux disease without esophagitis Is on omeprazole OTC which is not helping. He says his reflux as of late has been really bad.  5. Irritable bowel syndrome with both constipation and diarrhea He has more diarrhea then constipation. He is taking imodium BID. Helps most of the time.   6. Smoking Smokes over a pack a day.  7. ABUSE, ALCOHOL, EPISODIC Only drinks on Friday and saturdays. He drinks 6-10 beers each day. Is seeing counselor  Maye Hides and psych monthly.  8. Overweight (BMI 25.0-29.9) Weight is down 22lbs since last visit Wt Readings from Last 3 Encounters:  08/04/20 161 lb (73 kg)  02/20/18 183 lb (83 kg)  03/21/17 177 lb (80.3 kg)   BMI Readings from Last 3 Encounters:  08/04/20 23.78 kg/m  02/20/18 27.02 kg/m  03/21/17 26.14 kg/m       Outpatient Encounter Medications as of 08/04/2020  Medication Sig   ALPRAZolam (XANAX) 0.5 MG tablet Take 1 tablet (0.5 mg total) by mouth 2 (two) times daily as needed for anxiety.   B Complex Vitamins (VITAMIN B-COMPLEX) TABS Take 1 tablet by mouth daily.   loperamide (IMODIUM) 2 MG capsule Take by mouth.    Multiple Vitamin (MULTIVITAMIN) tablet Take 1 tablet by mouth daily.   omeprazole (PRILOSEC) 20 MG capsule Take 40 mg by mouth daily.   PARoxetine (PAXIL) 30 MG tablet Take 1 tablet (30 mg total) by mouth daily.   traZODone (DESYREL) 50 MG tablet Take 100 mg by mouth at bedtime as needed.   [DISCONTINUED] brompheniramine-pseudoephedrine-DM 30-2-10 MG/5ML syrup Take 5 mLs by mouth 4 (four) times daily as needed.   [DISCONTINUED] buPROPion (WELLBUTRIN XL) 300 MG 24 hr tablet Take 1 tablet (300 mg total) by mouth daily. (Patient not taking: No sig reported)   [DISCONTINUED] ciprofloxacin (CIPRO) 500 MG tablet Take 1 tablet (500 mg total) by mouth 2 (two) times daily.   [DISCONTINUED] dicyclomine (BENTYL) 10 MG capsule TAKE 1 CAPSULE (10 MG TOTAL) BY MOUTH 3 (THREE) TIMES DAILY BEFORE MEALS.   [DISCONTINUED] ondansetron (ZOFRAN) 4 MG tablet Take 1 tablet (4 mg total) by mouth every 8 (eight) hours as needed for nausea or vomiting.   [DISCONTINUED] pantoprazole (PROTONIX) 40 MG tablet TAKE 1 TABLET BY MOUTH ONCE DAILY   [DISCONTINUED] pravastatin (PRAVACHOL) 40 MG tablet Take 1 tablet (40 mg total) by mouth daily. (Patient not taking: Reported on 02/20/2018)   [DISCONTINUED] promethazine (PHENERGAN) 25 MG tablet Take 0.5-1 tablets (12.5-25 mg total) by mouth every 8 (eight) hours as needed  for nausea or vomiting. (Patient not taking: Reported on 02/20/2018)   [DISCONTINUED] ranitidine (ZANTAC) 150 MG tablet Take 1 tablet (150 mg total) by mouth 2 (two) times daily. (Patient not taking: Reported on 02/20/2018)   [DISCONTINUED] VENTOLIN HFA 108 (90 Base) MCG/ACT inhaler Inhale 2 puffs into the lungs every 6 (six) hours as needed for wheezing or shortness of breath. (Needs to be seen before next refill) (Patient not taking: Reported on 02/20/2018)   No facility-administered encounter medications on file as of 08/04/2020.    Past Surgical History:  Procedure Laterality Date   COLONOSCOPY N/A 06/26/2013    Procedure: COLONOSCOPY;  Surgeon: Rogene Houston, MD;  Location: [facility];  Service: Endoscopy;  Laterality: N/A;  1200   FRACTURE SURGERY Right    Jaw   HERNIA REPAIR      Family History  Problem Relation Age of Onset   Cancer Mother        breast   Heart disease Mother    Hyperlipidemia Mother    Atrial fibrillation Mother    COPD Father    Asthma Father     New complaints: None today  Social history: Lives by hisself but his girlfriend stays with him most of the time.  Controlled substance contract: n/a    Review of Systems  Constitutional:  Negative for diaphoresis.  Eyes:  Negative for pain.  Respiratory:  Negative for shortness of breath.   Cardiovascular:  Negative for chest pain, palpitations and leg swelling.  Gastrointestinal:  Positive for diarrhea. Negative for abdominal pain, blood in stool, constipation, nausea, rectal pain and vomiting.  Endocrine: Negative for polydipsia.  Skin:  Negative for rash.  Neurological:  Negative for dizziness, weakness and headaches.  Hematological:  Does not bruise/bleed easily.  All other systems reviewed and are negative.     Objective:   Physical Exam Vitals and nursing note reviewed.  Constitutional:      Appearance: Normal appearance. He is well-developed.  HENT:     Head: Normocephalic.     Nose: Nose normal.  Eyes:     Pupils: Pupils are equal, round, and reactive to light.  Neck:     Thyroid: No thyroid mass or thyromegaly.     Vascular: No carotid bruit or JVD.     Trachea: Phonation normal.  Cardiovascular:     Rate and Rhythm: Normal rate and regular rhythm.  Pulmonary:     Effort: Pulmonary effort is normal. No respiratory distress.     Breath sounds: Normal breath sounds.  Abdominal:     General: Bowel sounds are normal.     Palpations: Abdomen is soft.     Tenderness: There is no abdominal tenderness.  Musculoskeletal:        General: Normal range of motion.     Cervical back: Normal  range of motion and neck supple.  Lymphadenopathy:     Cervical: No cervical adenopathy.  Skin:    General: Skin is warm and dry.  Neurological:     Mental Status: He is alert and oriented to person, place, and time.  Psychiatric:        Behavior: Behavior normal.        Thought Content: Thought content normal.        Judgment: Judgment normal.    BP 117/73   Pulse 93   Temp 98.6 F (37 C) (Temporal)   Resp 20   Ht $R'5\' 9"'Sw$  (1.753 m)   Wt 161 lb (73 kg)  SpO2 98%   BMI 23.78 kg/m        Assessment & Plan:  Austin Gonzales comes in today with chief complaint of Annual Exam   Diagnosis and orders addressed:  1. Annual physical exam  - Thyroid Panel With TSH  2. Mixed hyperlipidemia Low fat diet - CBC with Differential/Platelet - CMP14+EGFR - Lipid panel  3. Adjustment disorder with anxiety Continue with counseling  4. Gastroesophageal reflux disease without esophagitis Avoid spicy foods Do not eat 2 hours prior to bedtime Added protonix to meds - Ambulatory referral to Gastroenterology - pantoprazole (PROTONIX) 40 MG tablet; Take 1 tablet (40 mg total) by mouth daily.  Dispense: 30 tablet; Refill: 3  5. Irritable bowel syndrome with both constipation and diarrhea - Ambulatory referral to Gastroenterology  6. Smoking Smoking cessation encouraged - CT CHEST LUNG CA SCREEN LOW DOSE W/O CM; Future  7. ABUSE, ALCOHOL, EPISODIC Try to stop drinking beer all together  8. BMI 23.0-23.9, adult No more weight loss  9. Prostate cancer screening - PSA, total and free   Labs pending Health Maintenance reviewed Diet and exercise encouraged  Follow up plan: 6 months   Willard, FNP

## 2020-08-04 NOTE — Patient Instructions (Signed)
Managing the Challenge of Quitting Smoking Quitting smoking is a physical and mental challenge. You will face cravings, withdrawal symptoms, and temptation. Before quitting, work with your health care provider to make a plan that can help you manage quitting. Preparation canhelp you quit and keep you from giving in. How to manage lifestyle changes Managing stress Stress can make you want to smoke, and wanting to smoke may cause stress. It is important to find ways to manage your stress. You might try some of the following: Practice relaxation techniques. Breathe slowly and deeply, in through your nose and out through your mouth. Listen to music. Soak in a bath or take a shower. Imagine a peaceful place or vacation. Get some support. Talk with family or friends about your stress. Join a support group. Talk with a counselor or therapist. Get some physical activity. Go for a walk, run, or bike ride. Play a favorite sport. Practice yoga.  Medicines Talk with your health care provider about medicines that might help you dealwith cravings and make quitting easier for you. Relationships Social situations can be difficult when you are quitting smoking. To manage this, you can: Avoid parties and other social situations where people might be smoking. Avoid alcohol. Leave right away if you have the urge to smoke. Explain to your family and friends that you are quitting smoking. Ask for support and let them know you might be a bit grumpy. Plan activities where smoking is not an option. General instructions Be aware that many people gain weight after they quit smoking. However, not everyone does. To keep from gaining weight, have a plan in place before you quit and stick to the plan after you quit. Your plan should include: Having healthy snacks. When you have a craving, it may help to: Eat popcorn, carrots, celery, or other cut vegetables. Chew sugar-free gum. Changing how you eat. Eat small  portion sizes at meals. Eat 4-6 small meals throughout the day instead of 1-2 large meals a day. Be mindful when you eat. Do not watch television or do other things that might distract you as you eat. Exercising regularly. Make time to exercise each day. If you do not have time for a long workout, do short bouts of exercise for 5-10 minutes several times a day. Do some form of strengthening exercise, such as weight lifting. Do some exercise that gets your heart beating and causes you to breathe deeply, such as walking fast, running, swimming, or biking. This is very important. Drinking plenty of water or other low-calorie or no-calorie drinks. Drink 6-8 glasses of water daily.  How to recognize withdrawal symptoms Your body and mind may experience discomfort as you try to get used to not having nicotine in your system. These effects are called withdrawal symptoms. They may include: Feeling hungrier than normal. Having trouble concentrating. Feeling irritable or restless. Having trouble sleeping. Feeling depressed. Craving a cigarette. To manage withdrawal symptoms: Avoid places, people, and activities that trigger your cravings. Remember why you want to quit. Get plenty of sleep. Avoid coffee and other caffeinated drinks. These may worsen some of your symptoms. These symptoms may surprise you. But be assured that they are normal to havewhen quitting smoking. How to manage cravings Come up with a plan for how to deal with your cravings. The plan should include the following: A definition of the specific situation you want to deal with. An alternative action you will take. A clear idea for how this action will help. The   name of someone who might help you with this. Cravings usually last for 5-10 minutes. Consider taking the following actions to help you with your plan to deal with cravings: Keep your mouth busy. Chew sugar-free gum. Suck on hard candies or a straw. Brush your  teeth. Keep your hands and body busy. Change to a different activity right away. Squeeze or play with a ball. Do an activity or a hobby, such as making bead jewelry, practicing needlepoint, or working with wood. Mix up your normal routine. Take a short exercise break. Go for a quick walk or run up and down stairs. Focus on doing something kind or helpful for someone else. Call a friend or family member to talk during a craving. Join a support group. Contact a quitline. Where to find support To get help or find a support group: Call the National Cancer Institute's Smoking Quitline: 1-800-QUIT NOW (784-8669) Visit the website of the Substance Abuse and Mental Health Services Administration: www.samhsa.gov Text QUIT to SmokefreeTXT: 478848 Where to find more information Visit these websites to find more information on quitting smoking: National Cancer Institute: www.smokefree.gov American Lung Association: www.lung.org American Cancer Society: www.cancer.org Centers for Disease Control and Prevention: www.cdc.gov American Heart Association: www.heart.org Contact a health care provider if: You want to change your plan for quitting. The medicines you are taking are not helping. Your eating feels out of control or you cannot sleep. Get help right away if: You feel depressed or become very anxious. Summary Quitting smoking is a physical and mental challenge. You will face cravings, withdrawal symptoms, and temptation to smoke again. Preparation can help you as you go through these challenges. Try different techniques to manage stress, handle social situations, and prevent weight gain. You can deal with cravings by keeping your mouth busy (such as by chewing gum), keeping your hands and body busy, calling family or friends, or contacting a quitline for people who want to quit smoking. You can deal with withdrawal symptoms by avoiding places where people smoke, getting plenty of rest, and  avoiding drinks with caffeine. This information is not intended to replace advice given to you by your health care provider. Make sure you discuss any questions you have with your healthcare provider. Document Revised: 10/30/2018 Document Reviewed: 10/30/2018 Elsevier Patient Education  2022 [employer]  

## 2020-08-05 LAB — THYROID PANEL WITH TSH
Free Thyroxine Index: 1.6 (ref 1.2–4.9)
T3 Uptake Ratio: 26 % (ref 24–39)
T4, Total: 6.1 ug/dL (ref 4.5–12.0)
TSH: 1.45 u[IU]/mL (ref 0.450–4.500)

## 2020-08-05 LAB — CBC WITH DIFFERENTIAL/PLATELET
Basophils Absolute: 0.1 10*3/uL (ref 0.0–0.2)
Basos: 1 %
EOS (ABSOLUTE): 0.2 10*3/uL (ref 0.0–0.4)
Eos: 3 %
Hematocrit: 34.7 % — ABNORMAL LOW (ref 37.5–51.0)
Hemoglobin: 12.3 g/dL — ABNORMAL LOW (ref 13.0–17.7)
Immature Grans (Abs): 0 10*3/uL (ref 0.0–0.1)
Immature Granulocytes: 0 %
Lymphocytes Absolute: 1.7 10*3/uL (ref 0.7–3.1)
Lymphs: 26 %
MCH: 36.9 pg — ABNORMAL HIGH (ref 26.6–33.0)
MCHC: 35.4 g/dL (ref 31.5–35.7)
MCV: 104 fL — ABNORMAL HIGH (ref 79–97)
Monocytes Absolute: 1 10*3/uL — ABNORMAL HIGH (ref 0.1–0.9)
Monocytes: 15 %
Neutrophils Absolute: 3.5 10*3/uL (ref 1.4–7.0)
Neutrophils: 55 %
Platelets: 194 10*3/uL (ref 150–450)
RBC: 3.33 x10E6/uL — ABNORMAL LOW (ref 4.14–5.80)
RDW: 11.6 % (ref 11.6–15.4)
WBC: 6.4 10*3/uL (ref 3.4–10.8)

## 2020-08-05 LAB — CMP14+EGFR
ALT: 18 IU/L (ref 0–44)
AST: 28 IU/L (ref 0–40)
Albumin/Globulin Ratio: 1.9 (ref 1.2–2.2)
Albumin: 4.6 g/dL (ref 3.8–4.9)
Alkaline Phosphatase: 56 IU/L (ref 44–121)
BUN/Creatinine Ratio: 6 — ABNORMAL LOW (ref 9–20)
BUN: 5 mg/dL — ABNORMAL LOW (ref 6–24)
Bilirubin Total: 0.3 mg/dL (ref 0.0–1.2)
CO2: 24 mmol/L (ref 20–29)
Calcium: 9.4 mg/dL (ref 8.7–10.2)
Chloride: 100 mmol/L (ref 96–106)
Creatinine, Ser: 0.86 mg/dL (ref 0.76–1.27)
Globulin, Total: 2.4 g/dL (ref 1.5–4.5)
Glucose: 93 mg/dL (ref 65–99)
Potassium: 4.1 mmol/L (ref 3.5–5.2)
Sodium: 141 mmol/L (ref 134–144)
Total Protein: 7 g/dL (ref 6.0–8.5)
eGFR: 102 mL/min/{1.73_m2} (ref >59)

## 2020-08-05 LAB — LIPID PANEL
Chol/HDL Ratio: 2.5 ratio (ref 0.0–5.0)
Cholesterol, Total: 208 mg/dL — ABNORMAL HIGH (ref 100–199)
HDL: 83 mg/dL (ref >39)
LDL Chol Calc (NIH): 100 mg/dL — ABNORMAL HIGH (ref 0–99)
Triglycerides: 150 mg/dL — ABNORMAL HIGH (ref 0–149)
VLDL Cholesterol Cal: 25 mg/dL (ref 5–40)

## 2020-08-05 LAB — PSA, TOTAL AND FREE
PSA, Free Pct: 42 %
PSA, Free: 0.21 ng/mL
Prostate Specific Ag, Serum: 0.5 ng/mL (ref 0.0–4.0)

## 2020-08-10 ENCOUNTER — Encounter (INDEPENDENT_AMBULATORY_CARE_PROVIDER_SITE_OTHER): Payer: Self-pay | Admitting: *Deleted

## 2020-09-02 DIAGNOSIS — F331 Major depressive disorder, recurrent, moderate: Secondary | ICD-10-CM | POA: Diagnosis not present

## 2020-09-02 DIAGNOSIS — F411 Generalized anxiety disorder: Secondary | ICD-10-CM | POA: Diagnosis not present

## 2020-09-24 ENCOUNTER — Ambulatory Visit (INDEPENDENT_AMBULATORY_CARE_PROVIDER_SITE_OTHER): Payer: No Typology Code available for payment source | Admitting: Gastroenterology

## 2020-09-24 ENCOUNTER — Encounter (HOSPITAL_COMMUNITY): Payer: Self-pay

## 2020-09-24 ENCOUNTER — Encounter (INDEPENDENT_AMBULATORY_CARE_PROVIDER_SITE_OTHER): Payer: Self-pay | Admitting: Gastroenterology

## 2020-09-24 ENCOUNTER — Other Ambulatory Visit: Payer: Self-pay

## 2020-09-24 ENCOUNTER — Other Ambulatory Visit (HOSPITAL_COMMUNITY): Payer: Self-pay

## 2020-09-24 VITALS — BP 125/81 | HR 86 | Temp 99.7°F | Ht 69.0 in | Wt 159.0 lb

## 2020-09-24 DIAGNOSIS — K529 Noninfective gastroenteritis and colitis, unspecified: Secondary | ICD-10-CM

## 2020-09-24 DIAGNOSIS — K580 Irritable bowel syndrome with diarrhea: Secondary | ICD-10-CM

## 2020-09-24 DIAGNOSIS — Z122 Encounter for screening for malignant neoplasm of respiratory organs: Secondary | ICD-10-CM

## 2020-09-24 DIAGNOSIS — Z87891 Personal history of nicotine dependence: Secondary | ICD-10-CM

## 2020-09-24 DIAGNOSIS — K219 Gastro-esophageal reflux disease without esophagitis: Secondary | ICD-10-CM

## 2020-09-24 MED ORDER — OMEPRAZOLE 40 MG PO CPDR: 40.0000 mg | DELAYED_RELEASE_CAPSULE | Freq: Every day | ORAL | 3 refills | Status: DC

## 2020-09-24 MED ORDER — DICYCLOMINE HCL 10 MG PO CAPS: 10.0000 mg | ORAL_CAPSULE | Freq: Two times a day (BID) | ORAL | 2 refills | Status: DC | PRN

## 2020-09-24 NOTE — Progress Notes (Signed)
Austin Gonzales, M.D. Gastroenterology & Hepatology [facility] [address] [place], [place] [address] Primary Care Physician: Austin Gonzales, [place] [address]  Referring MD: PCP  Chief Complaint: Diarrhea, weight loss and abdominal pain.  History of Present Illness: Austin Gonzales is a 55 y.o. 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  Patient states he has presented issues with diarrhea for many decades, possibly all his life.  He used to have 2-3 Bms per day which were mostly soft bowel movements. Patient reports that for the last 6 months he has presented worsening diarrhea episodes. States sometimes he can up to 7 BM, which most of the time is loose in consistency. He denies any melena or hematochezia, but states he feels soreness in the rectal area. He states he has lost close to 30 lb since his symptoms worsened. He has tried taking Peptobismol in the past. He also takes Imodium 2 tablets every day but it does not substantially help . He has had some episodes of fecal soiling , possibly 4 times in the last 3 months and has frequent fecal urgency. Very rarely he has diarrhea that wakes him up in the middle of the night. He notes that the symptoms worsen when he is about to go work or when he goes out of his home. He states sometimes he has abdominal cramping before he has an episode of diarrhea.  He has also presented some episodes of nausea, with very occasional vomiting (possibly 1-2 times a month).  He also reports that at the very least for the last 15 years he has presented recurrent episodes of heartburn and regurgitation. He was previously taking omeprazole 20 mg every day, but was recently switched to Protonix 40 mg every day (he thinks that this was performed a month ago).  He  usually waits 2 hours to have breakfast after taking his PPI. Has been taking sometimes an extra dose of OTC Prilosec if his symptoms are not very well controlled. He states he still has some sour taste usually in the morning despite taking the medication compliantly. No dysphagia or odynophagia.  The patient denies having any nausea, vomiting, fever, chills, hematochezia, melena, hematemesis, jaundice, pruritus.  Last [id number] Last Colonoscopy: 2015  Prep satisfactory. Normal mucosa of terminal ileum. 3 mm polyp ablated via cold biopsy from hepatic flexure. Tubular adenoma. Random mucosal biopsies taken from normal-appearing mucosa of sigmoid colon. Neg for microscopic colitis. Normal rectal mucosa. Small hemorrhoids below the dentate line.  FHx: neg for any gastrointestinal/liver disease, mother breast cancer Social: smokes 0.5 pack a day, no illicit drug use. Once a week drinks a 6 pack of beer. Surgical: hernia repair with mesh placement  Past Medical History: Past Medical History:  Diagnosis Date   Anemia    Anxiety    GERD (gastroesophageal reflux disease)    Hyperlipidemia     Past Surgical History: Past Surgical History:  Procedure Laterality Date   COLONOSCOPY N/A 06/26/2013   Procedure: COLONOSCOPY;  Surgeon: Austin Houston, MD;  Location: [facility];  Service: Endoscopy;  Laterality: N/A;  1200   FRACTURE SURGERY Right    Jaw   HERNIA REPAIR      Family History: Family History  Problem Relation Age of Onset   Cancer Mother        breast  Heart disease Mother    Hyperlipidemia Mother    Atrial fibrillation Mother    COPD Father    Asthma Father     Social History: Social History   Tobacco Use  Smoking Status Every Day   Packs/day: 0.50   Types: Cigarettes  Smokeless Tobacco Never  Tobacco Comments   1 pack a day x 15 yrs   Social History   Substance and Sexual Activity  Alcohol Use Yes   Comment: 1 case of beer on weekend   Social  History   Substance and Sexual Activity  Drug Use No    Allergies: Allergies  Allergen Reactions   Amoxicillin    Latex     blister    Medications: Current Outpatient Medications  Medication Sig Dispense Refill   ALPRAZolam (XANAX) 0.5 MG tablet Take 1 tablet (0.5 mg total) by mouth 2 (two) times daily as needed for anxiety. 60 tablet 0   B Complex Vitamins (VITAMIN B-COMPLEX) TABS Take 1 tablet by mouth daily.     loperamide (IMODIUM) 2 MG capsule Take by mouth.     Multiple Vitamin (MULTIVITAMIN) tablet Take 1 tablet by mouth daily.     omeprazole (PRILOSEC) 20 MG capsule Take 20 mg by mouth daily. Takes 2 daily     pantoprazole (PROTONIX) 40 MG tablet Take 1 tablet (40 mg total) by mouth daily. 30 tablet 3   PARoxetine (PAXIL) 30 MG tablet Take 1 tablet (30 mg total) by mouth daily. 30 tablet 5   traZODone (DESYREL) 50 MG tablet Take 100 mg by mouth at bedtime as needed.     No current facility-administered medications for this visit.    Review of Systems: GENERAL: negative for malaise, night sweats HEENT: No changes in hearing or vision, no nose bleeds or other nasal problems. NECK: Negative for lumps, goiter, pain and significant neck swelling RESPIRATORY: Negative for cough, wheezing CARDIOVASCULAR: Negative for chest pain, leg swelling, palpitations, orthopnea GI: SEE HPI MUSCULOSKELETAL: Negative for joint pain or swelling, back pain, and muscle pain. SKIN: Negative for lesions, rash PSYCH: Negative for sleep disturbance, mood disorder and recent psychosocial stressors. HEMATOLOGY Negative for prolonged bleeding, bruising easily, and swollen nodes. ENDOCRINE: Negative for cold or heat intolerance, polyuria, polydipsia and goiter. NEURO: negative for tremor, gait imbalance, syncope and seizures. The remainder of the review of systems is noncontributory.   Physical Exam: BP 125/81 (BP Location: Right Arm, Patient Position: Sitting, Cuff Size: Large)   Pulse 86    Temp 99.7 F (37.6 C) (Oral)   Ht '5\' 9"'$  (1.753 m)   Wt 159 lb (72.1 kg)   BMI 23.48 kg/m  GENERAL: The patient is AO x3, in no acute distress. HEENT: Head is normocephalic and atraumatic. EOMI are intact. Mouth is well hydrated and without lesions. NECK: Supple. No masses LUNGS: Clear to auscultation. No presence of rhonchi/wheezing/rales. Adequate chest expansion HEART: RRR, normal s1 and s2. ABDOMEN: Soft, nontender, no guarding, no peritoneal signs, and nondistended. BS +. No masses. EXTREMITIES: Without any cyanosis, clubbing, rash, lesions or edema. NEUROLOGIC: AOx3, no focal motor deficit. SKIN: no jaundice, no rashes   Imaging/Labs: as above  I personally reviewed and interpreted the available labs, imaging and endoscopic files.  Impression and Plan: Austin Gonzales is a 55 y.o. 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  The patient has presented chronic episodes of diarrhea which have  been attributed to IBS in the past.  He was somehow able to manage the symptoms previously but they have significantly worsened for the last 6 months and he has presented major amount of weight loss which is concerning.  Due to this, we will proceed with a repeat colonoscopy with repeat random colonic biopsies.  The patient agree understood.  We also obtain celiac serologies, GI pathogen panel, C. difficile testing and pancreatic elastase and fecal fat to evaluate his complaints further.  The patient will benefit from taking Bentyl as needed for episodes of abdominal pain and he can continue taking Imodium as needed for now to control his episodes of diarrhea. Finally, in terms of his recurrent heartburn, it is possible that this is related to bowel hypersensitivity as well but we will try to control his symptoms with higher dose stronger potency PPI such as omeprazole 40 mg every day, he  will need to stop taking the pantoprazole for now.  Further evaluation will also be performed with EGD as he has no presented any improvement while taking the PPI compliantly.  We will also obtain small bowel biopsies.  - Check celiac serology - Check C. Diff, GI path , fecal elastase and fecal fat - Schedule EGD and colonoscopy - Start Bentyl 1 tablet q12h as needed for abdominal pain - Stop Protonix, start omeprazole 40 mg every day - Continue Imodium as needed for diarrhea  All questions were answered.      Austin Peppers, MD Gastroenterology and Hepatology [facility]

## 2020-09-24 NOTE — Progress Notes (Signed)
Received referral for initial lung cancer screening scan. Contacted patient and obtained smoking history (started age 55 smoking about a 1/2PPD, current 1/2 PPD smoker, he reports that for a 10 year time span he smoked 1.5 PPD, 20.5 pack year history) as well as answering questions related to the screening process. Patient denies signs/symptoms of lung cancer such as weight loss or hemoptysis. Patient denies comorbidity that would prevent curative treatment if lung cancer were to be found.  Patient reports that he anticipates some upcoming GI procedures and will call me back to schedule LDCT.

## 2020-09-24 NOTE — Patient Instructions (Addendum)
Perform blood workup Perform stool workup Schedule EGD and colonoscopy Start Bentyl 1 tablet q12h as needed for abdominal pain Stop Protonix, start omeprazole 40 mg every day Continue Imodium as needed for diarrhea

## 2020-09-25 LAB — CELIAC DISEASE PANEL
(tTG) Ab, IgA: <1 U/mL
(tTG) Ab, IgG: <1 U/mL
Gliadin IgA: 3.1 U/mL
Gliadin IgG: <1 U/mL
Immunoglobulin A: 282 mg/dL (ref 47–310)

## 2020-10-06 ENCOUNTER — Other Ambulatory Visit (INDEPENDENT_AMBULATORY_CARE_PROVIDER_SITE_OTHER): Payer: Self-pay

## 2020-10-07 ENCOUNTER — Encounter (INDEPENDENT_AMBULATORY_CARE_PROVIDER_SITE_OTHER): Payer: Self-pay

## 2020-10-07 ENCOUNTER — Telehealth (INDEPENDENT_AMBULATORY_CARE_PROVIDER_SITE_OTHER): Payer: Self-pay

## 2020-10-07 MED ORDER — PEG 3350-KCL-NA BICARB-NACL 420 G PO SOLR: 4000.0000 mL | ORAL | 0 refills | Status: DC

## 2020-10-07 NOTE — Telephone Encounter (Signed)
Austin Gonzales, CMA  

## 2020-10-08 LAB — C. DIFFICILE GDH AND TOXIN A/B
GDH ANTIGEN: NOT DETECTED
MICRO NUMBER:: 12347915
SPECIMEN QUALITY:: ADEQUATE
TOXIN A AND B: NOT DETECTED

## 2020-10-08 LAB — FECAL FAT, QUALITATIVE: FECAL FAT, QUALITATIVE: NORMAL

## 2020-10-08 LAB — PANCREATIC ELASTASE, FECAL: Pancreatic Elastase-1, Stool: >500 mcg/g

## 2020-10-08 LAB — GASTROINTESTINAL PATHOGEN PANEL PCR
C. difficile Tox A/B, PCR: NOT DETECTED
Campylobacter, PCR: NOT DETECTED
Cryptosporidium, PCR: NOT DETECTED
E coli (ETEC) LT/ST PCR: NOT DETECTED
E coli (STEC) stx1/stx2, PCR: NOT DETECTED
E coli 0157, PCR: NOT DETECTED
Giardia lamblia, PCR: NOT DETECTED
Norovirus, PCR: NOT DETECTED
Rotavirus A, PCR: NOT DETECTED
Salmonella, PCR: NOT DETECTED
Shigella, PCR: NOT DETECTED

## 2020-11-02 NOTE — Patient Instructions (Signed)
Austin Gonzales  11/02/2020     @PREFPERIOPPHARMACY @   Your procedure is scheduled on  11/06/2020.   Report to [facility] at  0700 A.M.   Call this number if you have problems the morning of surgery:  [phone]   Remember:  Follow the diet and prep instructions given to you by the office.    Take these medicines the morning of surgery with A SIP OF WATER                 xanax (if needed), omeprazole.     Do not wear jewelry, make-up or nail polish.  Do not wear lotions, powders, or perfumes, or deodorant.  Do not shave 48 hours prior to surgery.  Men may shave face and neck.  Do not bring valuables to the hospital.  [facility] is not responsible for any belongings or valuables.  Contacts, dentures or bridgework may not be worn into surgery.  Leave your suitcase in the car.  After surgery it may be brought to your room.  For patients admitted to the hospital, discharge time will be determined by your treatment team.  Patients discharged the day of surgery will not be allowed to drive home and must have someone with them for 24 hours.    Special instructions:   DO NOT smoke tobacco or vape for 24 hours before your procedure.  Please read over the following fact sheets that you were given. Anesthesia Post-op Instructions and Care and Recovery After Surgery      Upper Endoscopy, Adult, Care After This sheet gives you information about how to care for yourself after your procedure. Your health care provider may also give you more specific instructions. If you have problems or questions, contact your health care provider. What can I expect after the procedure? After the procedure, it is common to have: A sore throat. Mild stomach pain or discomfort. Bloating. Nausea. Follow these instructions at home:  Follow instructions from your health care provider about what to eat or drink after your procedure. Return to your normal activities as told by your  health care provider. Ask your health care provider what activities are safe for you. Take over-the-counter and prescription medicines only as told by your health care provider. If you were given a sedative during the procedure, it can affect you for several hours. Do not drive or operate machinery until your health care provider says that it is safe. Keep all follow-up visits as told by your health care provider. This is important. Contact a health care provider if you have: A sore throat that lasts longer than one day. Trouble swallowing. Get help right away if: You vomit blood or your vomit looks like coffee grounds. You have: A fever. Bloody, black, or tarry stools. A severe sore throat or you cannot swallow. Difficulty breathing. Severe pain in your chest or abdomen. Summary After the procedure, it is common to have a sore throat, mild stomach discomfort, bloating, and nausea. If you were given a sedative during the procedure, it can affect you for several hours. Do not drive or operate machinery until your health care provider says that it is safe. Follow instructions from your health care provider about what to eat or drink after your procedure. Return to your normal activities as told by your health care provider. This information is not intended to replace advice given to you by your health care provider. Make sure you  discuss any questions you have with your health care provider. Document Revised: 01/08/2019 Document Reviewed: 06/12/2017 Elsevier Patient Education  2022 [employer]. Colonoscopy, Adult, Care After This sheet gives you information about how to care for yourself after your procedure. Your health care provider may also give you more specific instructions. If you have problems or questions, contact your health care provider. What can I expect after the procedure? After the procedure, it is common to have: A small amount of blood in your stool for 24 hours after the  procedure. Some gas. Mild cramping or bloating of your abdomen. Follow these instructions at home: Eating and drinking  Drink enough fluid to keep your urine pale yellow. Follow instructions from your health care provider about eating or drinking restrictions. Resume your normal diet as instructed by your health care provider. Avoid heavy or fried foods that are hard to digest. Activity Rest as told by your health care provider. Avoid sitting for a long time without moving. Get up to take short walks every 1-2 hours. This is important to improve blood flow and breathing. Ask for help if you feel weak or unsteady. Return to your normal activities as told by your health care provider. Ask your health care provider what activities are safe for you. Managing cramping and bloating  Try walking around when you have cramps or feel bloated. Apply heat to your abdomen as told by your health care provider. Use the heat source that your health care provider recommends, such as a moist heat pack or a heating pad. Place a towel between your skin and the heat source. Leave the heat on for 20-30 minutes. Remove the heat if your skin turns bright red. This is especially important if you are unable to feel pain, heat, or cold. You may have a greater risk of getting burned. General instructions If you were given a sedative during the procedure, it can affect you for several hours. Do not drive or operate machinery until your health care provider says that it is safe. For the first 24 hours after the procedure: Do not sign important documents. Do not drink alcohol. Do your regular daily activities at a slower pace than normal. Eat soft foods that are easy to digest. Take over-the-counter and prescription medicines only as told by your health care provider. Keep all follow-up visits as told by your health care provider. This is important. Contact a health care provider if: You have blood in your stool 2-3  days after the procedure. Get help right away if you have: More than a small spotting of blood in your stool. Large blood clots in your stool. Swelling of your abdomen. Nausea or vomiting. A fever. Increasing pain in your abdomen that is not relieved with medicine. Summary After the procedure, it is common to have a small amount of blood in your stool. You may also have mild cramping and bloating of your abdomen. If you were given a sedative during the procedure, it can affect you for several hours. Do not drive or operate machinery until your health care provider says that it is safe. Get help right away if you have a lot of blood in your stool, nausea or vomiting, a fever, or increased pain in your abdomen. This information is not intended to replace advice given to you by your health care provider. Make sure you discuss any questions you have with your health care provider. Document Revised: 01/04/2019 Document Reviewed: 08/06/2018 Elsevier Patient Education  2022 Elsevier  [place] After This sheet gives you information about how to care for yourself after your procedure. Your health care provider may also give you more specific instructions. If you have problems or questions, contact your health care provider. What can I expect after the procedure? After the procedure, it is common to have: Tiredness. Forgetfulness about what happened after the procedure. Impaired judgment for important decisions. Nausea or vomiting. Some difficulty with balance. Follow these instructions at home: For the time period you were told by your health care provider:   Rest as needed. Do not participate in activities where you could fall or become injured. Do not drive or use machinery. Do not drink alcohol. Do not take sleeping pills or medicines that cause drowsiness. Do not make important decisions or sign legal documents. Do not take care of children on your own. Eating  and drinking Follow the diet that is recommended by your health care provider. Drink enough fluid to keep your urine pale yellow. If you vomit: Drink water, juice, or soup when you can drink without vomiting. Make sure you have little or no nausea before eating solid foods. General instructions Have a responsible adult stay with you for the time you are told. It is important to have someone help care for you until you are awake and alert. Take over-the-counter and prescription medicines only as told by your health care provider. If you have sleep apnea, surgery and certain medicines can increase your risk for breathing problems. Follow instructions from your health care provider about wearing your sleep device: Anytime you are sleeping, including during daytime naps. While taking prescription pain medicines, sleeping medicines, or medicines that make you drowsy. Avoid smoking. Keep all follow-up visits as told by your health care provider. This is important. Contact a health care provider if: You keep feeling nauseous or you keep vomiting. You feel light-headed. You are still sleepy or having trouble with balance after 24 hours. You develop a rash. You have a fever. You have redness or swelling around the IV site. Get help right away if: You have trouble breathing. You have new-onset confusion at home. Summary For several hours after your procedure, you may feel tired. You may also be forgetful and have poor judgment. Have a responsible adult stay with you for the time you are told. It is important to have someone help care for you until you are awake and alert. Rest as told. Do not drive or operate machinery. Do not drink alcohol or take sleeping pills. Get help right away if you have trouble breathing, or if you suddenly become confused. This information is not intended to replace advice given to you by your health care provider. Make sure you discuss any questions you have with your  health care provider. Document Revised: 09/26/2019 Document Reviewed: 12/13/2018 Elsevier Patient Education  2022 [employer].

## 2020-11-04 ENCOUNTER — Encounter (HOSPITAL_COMMUNITY): Payer: Self-pay

## 2020-11-04 ENCOUNTER — Other Ambulatory Visit: Payer: Self-pay

## 2020-11-04 ENCOUNTER — Encounter (HOSPITAL_COMMUNITY)
Admission: RE | Admit: 2020-11-04 | Discharge: 2020-11-04 | Disposition: A | Discharge status: Home / Self Care | Payer: BC Managed Care – PPO | Source: Ambulatory Visit | Attending: Gastroenterology | Admitting: Gastroenterology

## 2020-11-04 DIAGNOSIS — K219 Gastro-esophageal reflux disease without esophagitis: Secondary | ICD-10-CM | POA: Diagnosis not present

## 2020-11-04 DIAGNOSIS — R12 Heartburn: Secondary | ICD-10-CM | POA: Diagnosis not present

## 2020-11-04 DIAGNOSIS — Z01810 Encounter for preprocedural cardiovascular examination: Secondary | ICD-10-CM | POA: Insufficient documentation

## 2020-11-04 DIAGNOSIS — Z91040 Latex allergy status: Secondary | ICD-10-CM | POA: Diagnosis not present

## 2020-11-04 DIAGNOSIS — K529 Noninfective gastroenteritis and colitis, unspecified: Secondary | ICD-10-CM | POA: Diagnosis not present

## 2020-11-04 DIAGNOSIS — Z79899 Other long term (current) drug therapy: Secondary | ICD-10-CM | POA: Diagnosis not present

## 2020-11-04 DIAGNOSIS — Z880 Allergy status to penicillin: Secondary | ICD-10-CM | POA: Diagnosis not present

## 2020-11-04 DIAGNOSIS — K5730 Diverticulosis of large intestine without perforation or abscess without bleeding: Secondary | ICD-10-CM | POA: Diagnosis not present

## 2020-11-04 DIAGNOSIS — D122 Benign neoplasm of ascending colon: Secondary | ICD-10-CM | POA: Diagnosis not present

## 2020-11-04 DIAGNOSIS — D123 Benign neoplasm of transverse colon: Secondary | ICD-10-CM | POA: Diagnosis not present

## 2020-11-04 DIAGNOSIS — K2289 Other specified disease of esophagus: Secondary | ICD-10-CM | POA: Diagnosis not present

## 2020-11-04 DIAGNOSIS — D120 Benign neoplasm of cecum: Secondary | ICD-10-CM | POA: Diagnosis not present

## 2020-11-04 DIAGNOSIS — F17200 Nicotine dependence, unspecified, uncomplicated: Secondary | ICD-10-CM | POA: Diagnosis not present

## 2020-11-06 ENCOUNTER — Encounter (HOSPITAL_COMMUNITY)
Admission: RE | Disposition: A | Discharge status: Home / Self Care | Payer: Self-pay | Source: Home / Self Care | Attending: Gastroenterology

## 2020-11-06 ENCOUNTER — Ambulatory Visit (HOSPITAL_COMMUNITY)
Admission: RE | Admit: 2020-11-06 | Discharge: 2020-11-06 | Disposition: A | Discharge status: Home / Self Care | Payer: BC Managed Care – PPO | Attending: Gastroenterology | Admitting: Gastroenterology

## 2020-11-06 ENCOUNTER — Ambulatory Visit (HOSPITAL_COMMUNITY): Payer: BC Managed Care – PPO | Admitting: Anesthesiology

## 2020-11-06 ENCOUNTER — Encounter (HOSPITAL_COMMUNITY): Payer: Self-pay | Admitting: Gastroenterology

## 2020-11-06 DIAGNOSIS — D120 Benign neoplasm of cecum: Secondary | ICD-10-CM | POA: Diagnosis not present

## 2020-11-06 DIAGNOSIS — Z79899 Other long term (current) drug therapy: Secondary | ICD-10-CM | POA: Diagnosis not present

## 2020-11-06 DIAGNOSIS — R197 Diarrhea, unspecified: Secondary | ICD-10-CM

## 2020-11-06 DIAGNOSIS — K219 Gastro-esophageal reflux disease without esophagitis: Secondary | ICD-10-CM | POA: Insufficient documentation

## 2020-11-06 DIAGNOSIS — Z91040 Latex allergy status: Secondary | ICD-10-CM | POA: Insufficient documentation

## 2020-11-06 DIAGNOSIS — K529 Noninfective gastroenteritis and colitis, unspecified: Secondary | ICD-10-CM | POA: Diagnosis not present

## 2020-11-06 DIAGNOSIS — F17200 Nicotine dependence, unspecified, uncomplicated: Secondary | ICD-10-CM | POA: Diagnosis not present

## 2020-11-06 DIAGNOSIS — D122 Benign neoplasm of ascending colon: Secondary | ICD-10-CM | POA: Diagnosis not present

## 2020-11-06 DIAGNOSIS — R12 Heartburn: Secondary | ICD-10-CM | POA: Insufficient documentation

## 2020-11-06 DIAGNOSIS — K5730 Diverticulosis of large intestine without perforation or abscess without bleeding: Secondary | ICD-10-CM | POA: Diagnosis not present

## 2020-11-06 DIAGNOSIS — K2289 Other specified disease of esophagus: Secondary | ICD-10-CM | POA: Diagnosis not present

## 2020-11-06 DIAGNOSIS — F419 Anxiety disorder, unspecified: Secondary | ICD-10-CM | POA: Diagnosis not present

## 2020-11-06 DIAGNOSIS — D123 Benign neoplasm of transverse colon: Secondary | ICD-10-CM

## 2020-11-06 DIAGNOSIS — Z880 Allergy status to penicillin: Secondary | ICD-10-CM | POA: Diagnosis not present

## 2020-11-06 DIAGNOSIS — K635 Polyp of colon: Secondary | ICD-10-CM | POA: Diagnosis not present

## 2020-11-06 DIAGNOSIS — K208 Other esophagitis without bleeding: Secondary | ICD-10-CM | POA: Diagnosis not present

## 2020-11-06 HISTORY — PX: BIOPSY: SHX5522

## 2020-11-06 HISTORY — PX: COLONOSCOPY WITH PROPOFOL: SHX5780

## 2020-11-06 HISTORY — PX: ESOPHAGOGASTRODUODENOSCOPY (EGD) WITH PROPOFOL: SHX5813

## 2020-11-06 HISTORY — PX: POLYPECTOMY: SHX5525

## 2020-11-06 LAB — HM COLONOSCOPY

## 2020-11-06 SURGERY — COLONOSCOPY WITH PROPOFOL
Anesthesia: General

## 2020-11-06 MED ORDER — COLESTIPOL HCL 1 G PO TABS: 2.0000 g | ORAL_TABLET | Freq: Every day | ORAL | 1 refills | Status: DC

## 2020-11-06 MED ORDER — PROPOFOL 500 MG/50ML IV EMUL
INTRAVENOUS | Status: DC | PRN
  Administered 2020-11-06: 150 ug/kg/min via INTRAVENOUS

## 2020-11-06 MED ORDER — PROPOFOL 1000 MG/100ML IV EMUL
INTRAVENOUS | Status: AC
  Filled 2020-11-06: qty 100

## 2020-11-06 MED ORDER — PROPOFOL 10 MG/ML IV BOLUS
INTRAVENOUS | Status: DC | PRN
  Administered 2020-11-06 (×2): 50 mg via INTRAVENOUS

## 2020-11-06 MED ORDER — PROPOFOL 500 MG/50ML IV EMUL
INTRAVENOUS | Status: AC
  Filled 2020-11-06: qty 50

## 2020-11-06 MED ORDER — LACTATED RINGERS IV SOLN
INTRAVENOUS | Status: DC
  Administered 2020-11-06: 1000 mL via INTRAVENOUS

## 2020-11-06 NOTE — Op Note (Addendum)
[facility] Patient Name: Austin Gonzales Procedure Date: 11/06/2020 9:29 AM MRN: [id number] Date of Birth: 10/13/1965 Attending MD: [name] ,  CSN: [id number] Age: 55 Admit Type: Outpatient Procedure:                Colonoscopy Indications:              Chronic diarrhea Providers:                [name], Lurline Del, RN, Casimer Bilis, Technician Referring MD:              Medicines:                Monitored Anesthesia Care Complications:            No immediate complications. Estimated Blood Loss:     Estimated blood loss: none. Procedure:                Pre-Anesthesia Assessment:                           - Prior to the procedure, a History and Physical                            was performed, and patient medications, allergies                            and sensitivities were reviewed. The patient's                            tolerance of previous anesthesia was reviewed.                           - The risks and benefits of the procedure and the                            sedation options and risks were discussed with the                            patient. All questions were answered and informed                            consent was obtained.                           - ASA Grade Assessment: II - A patient with mild                            systemic disease.                           After obtaining informed consent, the colonoscope                            was passed under direct vision. Throughout the  procedure, the patient's blood pressure, pulse, and                            oxygen saturations were monitored continuously. The                            [id number]-[id number] ([id number]) scope was introduced through                            the anus and advanced to the the terminal ileum.                            The colonoscopy was performed without difficulty.                            The  patient tolerated the procedure well. The                            quality of the bowel preparation was good. Scope In: 9:35:47 AM Scope Out: 10:03:05 AM Scope Withdrawal Time: 0 hours 23 minutes 27 seconds  Total Procedure Duration: 0 hours 27 minutes 18 seconds  Findings:      The terminal ileum appeared normal.      Three sessile polyps were found in the transverse colon, ascending colon       and cecum. The polyps were 3 to 4 mm in size. These polyps were removed       with a cold snare. Resection and retrieval were complete.      A few small-mouthed diverticula were found in the sigmoid colon.      The rest of the colon appeared normal. Biopsies for histology were taken       with a cold forceps from the right colon and left colon for evaluation       of microscopic colitis.      The retroflexed view of the distal rectum and anal verge was normal and       showed no anal or rectal abnormalities. Impression:               - The examined portion of the ileum was normal.                           - Three 3 to 4 mm polyps in the transverse colon,                            in the ascending colon and in the cecum, removed                            with a cold snare. Resected and retrieved.                           - Diverticulosis in the sigmoid colon.                           - The rest of the examined colon is normal.  Biopsied.                           - The distal rectum and anal verge are normal on                            retroflexion view. Moderate Sedation:      Per Anesthesia Care Recommendation:           - Discharge patient to home (ambulatory).                           - Resume previous diet.                           - Await pathology results.                           - Repeat colonoscopy for surveillance based on                            pathology results.                           - Start colestipol 2 g every day.                            - Can take Imodium as needed for persistent                            diarrhea. Procedure Code(s):        --- Professional ---                           [phone], Colonoscopy, flexible; with removal of                            tumor(s), polyp(s), or other lesion(s) by snare                            technique Diagnosis Code(s):        --- Professional ---                           K63.5, Polyp of colon                           K52.9, Noninfective gastroenteritis and colitis,                            unspecified                           K57.30, Diverticulosis of large intestine without                            perforation or abscess without bleeding CPT copyright 2019 American Medical Association. All rights reserved. The codes documented in this report are preliminary and  upon coder review may  be revised to meet current compliance requirements. [name], MD [name],  11/06/2020 10:07:36 AM This report has been signed electronically. Number of Addenda: 0

## 2020-11-06 NOTE — H&P (Signed)
Austin Gonzales is an 55 y.o. male.   Chief Complaint: Abdominal pain, weight loss and chronic diarrhea HPI: Austin Gonzales is a 55 y.o. 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  The patient has presented chronic history of diarrhea for multiple decades.  He is currently having up to 6 bowel movements per day which are watery in nature without blood in the stool or melena even though he is taking up to 2 tablets of Imodium every day.  He also presents intermittent episodes of abdominal pain in his lower abdomen described as cramping and bloating intermittently.  Underwent recent testing which showed negative celiac disease panel in serology, stool testing was negative for pancreatic insufficiency and infections.  Last YYT:KPTWS Last Colonoscopy: 2015  Prep satisfactory. Normal mucosa of terminal ileum. 3 mm polyp ablated via cold biopsy from hepatic flexure. Tubular adenoma. Random mucosal biopsies taken from normal-appearing mucosa of sigmoid colon. Neg for microscopic colitis. Normal rectal mucosa. Small hemorrhoids below the dentate line.  Past Medical History:  Diagnosis Date   Anemia    Anxiety    GERD (gastroesophageal reflux disease)    Hyperlipidemia     Past Surgical History:  Procedure Laterality Date   COLONOSCOPY N/A 06/26/2013   Procedure: COLONOSCOPY;  Surgeon: Rogene Houston, MD;  [facility];  Service: Endoscopy;  Laterality: N/A;  1200   FRACTURE SURGERY Right    Jaw   HERNIA REPAIR     umbilical    Family History  Problem Relation Age of Onset   Cancer Mother        breast   Heart disease Mother    Hyperlipidemia Mother    Atrial fibrillation Mother    COPD Father    Asthma Father    Social History:  reports that he has been smoking cigarettes. He has a 7.50 pack-year smoking history. He has never used smokeless tobacco.  He reports current alcohol use. He reports that he does not use drugs.  Allergies:  Allergies  Allergen Reactions   Amoxicillin Diarrhea   Latex     blister    Medications Prior to Admission  Medication Sig Dispense Refill   ALPRAZolam (XANAX) 0.5 MG tablet Take 1 tablet (0.5 mg total) by mouth 2 (two) times daily as needed for anxiety. 60 tablet 0   dicyclomine (BENTYL) 10 MG capsule Take 1 capsule (10 mg total) by mouth every 12 (twelve) hours as needed (abdominal pain). 60 capsule 2   fluticasone (FLONASE) 50 MCG/ACT nasal spray Place 1 spray into both nostrils daily as needed for allergies or rhinitis.     loperamide (IMODIUM) 2 MG capsule Take 2 mg by mouth 2 (two) times daily.     omeprazole (PRILOSEC) 40 MG capsule Take 1 capsule (40 mg total) by mouth daily. 90 capsule 3   PARoxetine (PAXIL) 30 MG tablet Take 1 tablet (30 mg total) by mouth daily. (Patient not taking: No sig reported) 30 tablet 5   polyethylene glycol-electrolytes (TRILYTE) 420 g solution Take 4,000 mLs by mouth as directed. 4000 mL 0    No results found for this or any previous visit (from the past 48 hour(s)). No results found.  Review of Systems  Constitutional:  Positive for unexpected weight change.  HENT: Negative.    Eyes: Negative.   Respiratory: Negative.    Cardiovascular: Negative.   Gastrointestinal:  Positive  for abdominal distention, abdominal pain and diarrhea.  Endocrine: Negative.   Genitourinary: Negative.   Musculoskeletal: Negative.   Skin: Negative.   Allergic/Immunologic: Negative.   Neurological: Negative.   Hematological: Negative.   Psychiatric/Behavioral: Negative.     Blood pressure 122/78, pulse (!) 53, temperature 98.1 F (36.7 C), temperature source Oral, resp. rate 20, SpO2 98 %. Physical Exam  GENERAL: The patient is AO x3, in no acute distress. HEENT: Head is normocephalic and atraumatic. EOMI are intact. Mouth is well hydrated and without lesions. NECK: Supple. No  masses LUNGS: Clear to auscultation. No presence of rhonchi/wheezing/rales. Adequate chest expansion HEART: RRR, normal s1 and s2. ABDOMEN: Soft, nontender, no guarding, no peritoneal signs, and nondistended. BS +. No masses. EXTREMITIES: Without any cyanosis, clubbing, rash, lesions or edema. NEUROLOGIC: AOx3, no focal motor deficit. SKIN: no jaundice, no rashes  Assessment/Plan Austin Gonzales is a 55 y.o. 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 Will proceed with Egd and colonoscopy.  Harvel Quale, MD 11/06/2020, 8:39 AM

## 2020-11-06 NOTE — Anesthesia Postprocedure Evaluation (Signed)
Anesthesia Post Note  Patient: Austin Gonzales  Procedure(s) Performed: COLONOSCOPY WITH PROPOFOL ESOPHAGOGASTRODUODENOSCOPY (EGD) WITH PROPOFOL BIOPSY POLYPECTOMY  Patient location during evaluation: Phase II Anesthesia Type: General Level of consciousness: awake and alert and oriented Pain management: pain level controlled Vital Signs Assessment: post-procedure vital signs reviewed and stable Respiratory status: spontaneous breathing, nonlabored ventilation and respiratory function stable Cardiovascular status: blood pressure returned to baseline and stable Postop Assessment: no apparent nausea or vomiting Anesthetic complications: no   No notable events documented.   Last Vitals:  Vitals:   11/06/20 1009 11/06/20 1011  BP: (!) 89/56 105/74  Pulse: 84   Resp: (!) 22 19  Temp: 36.5 C   SpO2: 100%     Last Pain:  Vitals:   11/06/20 1009  TempSrc: Oral  PainSc: 0-No pain                 Tzvi Economou C Mcgwire Dasaro

## 2020-11-06 NOTE — Transfer of Care (Signed)
Immediate Anesthesia Transfer of Care Note  Patient: Austin Gonzales  Procedure(s) Performed: COLONOSCOPY WITH PROPOFOL ESOPHAGOGASTRODUODENOSCOPY (EGD) WITH PROPOFOL BIOPSY POLYPECTOMY  Patient Location: PACU  Anesthesia Type:General  Level of Consciousness: awake, alert , oriented and patient cooperative  Airway & Oxygen Therapy: Patient Spontanous Breathing  Post-op Assessment: Report given to RN, Post -op Vital signs reviewed and stable and Patient moving all extremities X 4  Post vital signs: Reviewed and stable  Last Vitals:  Vitals Value Taken Time  BP 105/74 11/06/20 1011  Temp 36.5 C 11/06/20 1009  Pulse 84 11/06/20 1009  Resp 19 11/06/20 1011  SpO2 100 % 11/06/20 1009    Last Pain:  Vitals:   11/06/20 1009  TempSrc: Oral  PainSc: 0-No pain      Patients Stated Pain Goal: 8 (72/90/21 1155)  Complications: No notable events documented.

## 2020-11-06 NOTE — Anesthesia Preprocedure Evaluation (Signed)
Anesthesia Evaluation  Patient identified by MRN, date of birth, ID band Patient awake    Reviewed: Allergy & Precautions, NPO status , Patient's Chart, lab work & pertinent test results  History of Anesthesia Complications Negative for: history of anesthetic complications  Airway Mallampati: II  TM Distance: >3 FB Neck ROM: Full    Dental  (+) Dental Advisory Given, Missing, Chipped   Pulmonary Current Smoker and Patient abstained from smoking.,    Pulmonary exam normal breath sounds clear to auscultation       Cardiovascular Exercise Tolerance: Good Normal cardiovascular exam Rhythm:Regular Rate:Normal     Neuro/Psych PSYCHIATRIC DISORDERS Anxiety    GI/Hepatic Neg liver ROS, GERD  Medicated,  Endo/Other  negative endocrine ROS  Renal/GU negative Renal ROS     Musculoskeletal negative musculoskeletal ROS (+)   Abdominal   Peds  Hematology  (+) anemia ,   Anesthesia Other Findings   Reproductive/Obstetrics negative OB ROS                             Anesthesia Physical Anesthesia Plan  ASA: 2  Anesthesia Plan: General   Post-op Pain Management:    Induction: Intravenous  PONV Risk Score and Plan: TIVA  Airway Management Planned: Nasal Cannula and Natural Airway  Additional Equipment:   Intra-op Plan:   Post-operative Plan:   Informed Consent: I have reviewed the patients History and Physical, chart, labs and discussed the procedure including the risks, benefits and alternatives for the proposed anesthesia with the patient or authorized representative who has indicated his/her understanding and acceptance.     Dental advisory given  Plan Discussed with: CRNA and Surgeon  Anesthesia Plan Comments:         Anesthesia Quick Evaluation

## 2020-11-06 NOTE — Op Note (Signed)
[facility] Patient Name: Austin Gonzales Procedure Date: 11/06/2020 9:02 AM MRN: [id number] Date of Birth: 07-10-1965 Attending MD: [name] ,  CSN: [id number] Age: 55 Admit Type: Outpatient Procedure:                Upper GI endoscopy Indications:              Heartburn, Diarrhea Providers:                [name], Lurline Del, RN, Casimer Bilis, Technician Referring MD:              Medicines:                Monitored Anesthesia Care Complications:            No immediate complications. Estimated Blood Loss:     Estimated blood loss: none. Procedure:                Pre-Anesthesia Assessment:                           - Prior to the procedure, a History and Physical                            was performed, and patient medications, allergies                            and sensitivities were reviewed. The patient's                            tolerance of previous anesthesia was reviewed.                           - The risks and benefits of the procedure and the                            sedation options and risks were discussed with the                            patient. All questions were answered and informed                            consent was obtained.                           - ASA Grade Assessment: II - A patient with mild                            systemic disease.                           After obtaining informed consent, the endoscope was                            passed under direct vision. Throughout the  procedure, the patient's blood pressure, pulse, and                            oxygen saturations were monitored continuously. The                            [id number]-H190 ([id number]) scope was introduced through the                            mouth, and advanced to the second part of duodenum.                            The upper GI endoscopy was accomplished without                             difficulty. The patient tolerated the procedure                            well. Scope In: 9:17:35 AM Scope Out: 9:28:05 AM Total Procedure Duration: 0 hours 10 minutes 30 seconds  Findings:      Two tongues of salmon-colored mucosa were present from 38 to 40 cm.       Squamous islands were present at 38 cm. The maximum longitudinal extent       of these esophageal mucosal changes was 2 cm in length. Biopsies were       taken with a cold forceps for histology.      The exam of the esophagus was otherwise normal.      The entire examined stomach was normal.      The examined duodenum was normal. Biopsies were taken with a cold       forceps for histology. Impression:               - Salmon-colored mucosa suspicious for                            short-segment Barrett's esophagus. Biopsied.                           - Normal stomach.                           - Normal examined duodenum. Biopsied. Moderate Sedation:      Per Anesthesia Care Recommendation:           - Discharge patient to home (ambulatory).                           - Resume previous diet.                           - Await pathology results.                           - Continue present medications. Procedure Code(s):        --- Professional ---  63845, Esophagogastroduodenoscopy, flexible,                            transoral; with biopsy, single or multiple Diagnosis Code(s):        --- Professional ---                           K22.8, Other specified diseases of esophagus                           R12, Heartburn                           R19.7, Diarrhea, unspecified CPT copyright 2019 American Medical Association. All rights reserved. The codes documented in this report are preliminary and upon coder review may  be revised to meet current compliance requirements. [name], MD [name],  11/06/2020 9:32:42 AM This report has been signed electronically. Number of  Addenda: 0

## 2020-11-06 NOTE — Discharge Instructions (Addendum)
You are being discharged to home.  Resume your previous diet.  We are waiting for your pathology results.  Continue your present medications.  You are being discharged to home.  Your physician has recommended a repeat colonoscopy for surveillance based on pathology results.  Start colestipol 2 g every day. Can take Imodium as needed for persistent diarrhea.

## 2020-11-09 LAB — SURGICAL PATHOLOGY

## 2020-11-10 ENCOUNTER — Encounter (HOSPITAL_COMMUNITY): Payer: Self-pay | Admitting: Gastroenterology

## 2020-11-10 ENCOUNTER — Encounter (INDEPENDENT_AMBULATORY_CARE_PROVIDER_SITE_OTHER): Payer: Self-pay | Admitting: *Deleted

## 2020-11-23 DIAGNOSIS — F331 Major depressive disorder, recurrent, moderate: Secondary | ICD-10-CM | POA: Diagnosis not present

## 2020-11-23 DIAGNOSIS — F411 Generalized anxiety disorder: Secondary | ICD-10-CM | POA: Diagnosis not present

## 2020-12-07 ENCOUNTER — Other Ambulatory Visit (INDEPENDENT_AMBULATORY_CARE_PROVIDER_SITE_OTHER): Payer: Self-pay | Admitting: Gastroenterology

## 2020-12-07 ENCOUNTER — Telehealth (INDEPENDENT_AMBULATORY_CARE_PROVIDER_SITE_OTHER): Payer: Self-pay

## 2020-12-07 DIAGNOSIS — K580 Irritable bowel syndrome with diarrhea: Secondary | ICD-10-CM

## 2020-12-07 MED ORDER — HYOSCYAMINE SULFATE 0.125 MG SL SUBL: 0.1250 mg | SUBLINGUAL_TABLET | Freq: Four times a day (QID) | SUBLINGUAL | 0 refills | Status: DC | PRN

## 2020-12-07 NOTE — Telephone Encounter (Signed)
Patient called today stating he has had diarrhea on going for the last four days. His dicyclomine is on back order at [facility], and should be there tomorrow. Is there something else he can do or another medication he can use. If so he wants it to go to the [facility]. Please advise.

## 2020-12-07 NOTE — Telephone Encounter (Signed)
Spoke with the patient, reported that he is diarrhea has been getting worse since he ran out of his Bentyl.  He is waiting to get a refill at his pharmacy, possibly tomorrow.  For now I will prescribe Levsin as needed but I advised him to stop taking the Levsin once he gets his Bentyl.  He can also take Pepto-Bismol as needed as he was not able to tolerate the colestipol.

## 2020-12-09 ENCOUNTER — Other Ambulatory Visit (INDEPENDENT_AMBULATORY_CARE_PROVIDER_SITE_OTHER): Payer: Self-pay

## 2020-12-09 DIAGNOSIS — K580 Irritable bowel syndrome with diarrhea: Secondary | ICD-10-CM

## 2020-12-09 MED ORDER — DICYCLOMINE HCL 10 MG PO CAPS: 10.0000 mg | ORAL_CAPSULE | Freq: Two times a day (BID) | ORAL | 2 refills | Status: DC | PRN

## 2020-12-14 ENCOUNTER — Ambulatory Visit (INDEPENDENT_AMBULATORY_CARE_PROVIDER_SITE_OTHER): Payer: No Typology Code available for payment source | Admitting: Gastroenterology

## 2020-12-21 ENCOUNTER — Other Ambulatory Visit: Payer: Self-pay | Admitting: Nurse Practitioner

## 2020-12-21 DIAGNOSIS — K219 Gastro-esophageal reflux disease without esophagitis: Secondary | ICD-10-CM

## 2020-12-30 ENCOUNTER — Other Ambulatory Visit: Payer: Self-pay | Admitting: Nurse Practitioner

## 2020-12-30 DIAGNOSIS — K219 Gastro-esophageal reflux disease without esophagitis: Secondary | ICD-10-CM

## 2021-01-11 ENCOUNTER — Encounter (INDEPENDENT_AMBULATORY_CARE_PROVIDER_SITE_OTHER): Payer: Self-pay | Admitting: Gastroenterology

## 2021-01-11 ENCOUNTER — Other Ambulatory Visit: Payer: Self-pay

## 2021-01-11 ENCOUNTER — Ambulatory Visit (INDEPENDENT_AMBULATORY_CARE_PROVIDER_SITE_OTHER): Payer: BC Managed Care – PPO | Admitting: Gastroenterology

## 2021-01-11 VITALS — BP 129/80 | HR 99 | Temp 98.6°F | Ht 70.0 in | Wt 159.5 lb

## 2021-01-11 DIAGNOSIS — K580 Irritable bowel syndrome with diarrhea: Secondary | ICD-10-CM | POA: Diagnosis not present

## 2021-01-11 DIAGNOSIS — K219 Gastro-esophageal reflux disease without esophagitis: Secondary | ICD-10-CM

## 2021-01-11 NOTE — Patient Instructions (Addendum)
Continue omeprazole 40 mg every day. Can take Pepcid 20 mg over-the-counter as needed for breakthrough episodes of heartburn Explained presumed etiology of IBS symptoms. Patient was counseled about the benefit of implementing a low FODMAP to improve symptoms and recurrent episodes. A dietary list was provided to the patient. Also, the patient was counseled about the benefit of avoiding stressing situations and potential environmental triggers leading to symptomatology. Increase Bentyl to 3 times a day Can take Imodium as needed for breakthrough episodes of diarrhea.

## 2021-01-11 NOTE — Progress Notes (Signed)
Austin Gonzales, M.D. Gastroenterology & Hepatology [facility] [address] [place], [place] [address]  Primary Care Physician: Austin Gonzales, [place] [address]  I will communicate my assessment and recommendations to the referring MD via EMR.  Problems: IBS-D GERD  History of Present Illness: Austin Gonzales is a 55 y.o. male with PMH melanoma of the lip s/p resection, IBS, anxiety, GERD, hyperlipidemia and alcohol abuse, who presents for follow up of IBS and GERD.  The patient was last seen on 09/24/2020. At that time, the patient was ordered to have stool testing that came back negative for C. difficile or GI pathogen panel, it was also negative for fat malabsorption and fecal elastase was normal.  Celiac serologies were within normal limits.  He also underwent an EGD and a colonoscopy with findings grade below.  He was given a prescription for Bentyl every 12 hours as needed for abdominal pain and he was switched to omeprazole 40 mg every day.  Patient was eventually started on colestipol as he was having persistent diarrhea even though he was taking Imodium as needed.  Patient states that he has intermittent episodes of urgency and diarrhea after eating, possibly once a week.  Has felt Bentyl 10 mg every 12 hours has helped slowing down his diarrhea. Occasionally he has some episodes of abdominal pain, but is less frequent than before. Takes Imodium but does not work all the time for him. Did not tolerate colestipol as it cause worsening diarrhea, so he is not taking it anymore.  Overall, he has felt better compared to prior.  States that regarding his GERD, he is presenting episodes of heartburn occasionally - possibly once a week.  He reported this is better controlled than when he was taking Protonix.  He is taking omeprazole 40 mg every day and usually waits a couple of hours to take it. Thins the  breakthrough are possibly related to food intake. No dysphagia or odynophagia.  Has tried to "eat better" but not following any specific type of diet.  He drinks at least a six pack on Fridays and Sundays. He does not think he will be able to stop this pattern.  The patient denies having any nausea, vomiting, fever, chills, hematochezia, melena, hematemesis, abdominal distention, abdominal pain, jaundice, pruritus or weight loss.  Patient reports that he lost significant amount of weight during the last few years.  Noted to have some weight today as his last appointment in September 2022.  Last EGD:  11/06/2020, there were 2 tongues of salmon-colored mucosa extending between 38 to 40 cm which were biopsied but were negative for H. pylori, consistent with chronic inflammation.  Stomach and duodenum were normal.  Biopsies were negative for celiac disease.  Recommended to repeat in 2 years.  Last Colonoscopy: 11/06/2020, normal terminal ileum, there were 3 small polyps in the transverse colon, ascending colon and cecum which were resected with cold snare (pathology tubular adenoma x3), diverticulosis.  Random colonic biopsies were negative for microscopic colitis.  Recommended to repeat in 5 years.  Past Medical History: Past Medical History:  Diagnosis Date   Anemia    Anxiety    GERD (gastroesophageal reflux disease)    Hyperlipidemia     Past Surgical History: Past Surgical History:  Procedure Laterality Date   BIOPSY  11/06/2020   Procedure: BIOPSY;  Surgeon: Harvel Quale, MD;  [facility];  Service: Gastroenterology;;  small bowel,  GE junction,random colon biopsies   COLONOSCOPY N/A 06/26/2013   Procedure: COLONOSCOPY;  Surgeon: Rogene Houston, MD;  [facility];  Service: Endoscopy;  Laterality: N/A;  1200   COLONOSCOPY WITH PROPOFOL N/A 11/06/2020   Procedure: COLONOSCOPY WITH PROPOFOL;  Surgeon: Harvel Quale, MD;  [facility];  Service: Gastroenterology;  Laterality: N/A;  9:20   ESOPHAGOGASTRODUODENOSCOPY (EGD) WITH PROPOFOL N/A 11/06/2020   Procedure: ESOPHAGOGASTRODUODENOSCOPY (EGD) WITH PROPOFOL;  Surgeon: Harvel Quale, MD;  [facility];  Service: Gastroenterology;  Laterality: N/A;   FRACTURE SURGERY Right    Jaw   HERNIA REPAIR     umbilical   POLYPECTOMY  11/06/2020   Procedure: POLYPECTOMY;  Surgeon: Montez Morita, Quillian Quince, MD;  [facility];  Service: Gastroenterology;;  cecal;ascending;    Family History: Family History  Problem Relation Age of Onset   Cancer Mother        breast   Heart disease Mother    Hyperlipidemia Mother    Atrial fibrillation Mother    COPD Father    Asthma Father     Social History: Social History   Tobacco Use  Smoking Status Every Day   Packs/day: 0.50   Years: 15.00   Pack years: 7.50   Types: Cigarettes  Smokeless Tobacco Never  Tobacco Comments   1 pack a day x 15 yrs   Social History   Substance and Sexual Activity  Alcohol Use Yes   Comment: drinks 6 beers on weekend   Social History   Substance and Sexual Activity  Drug Use No    Allergies: Allergies  Allergen Reactions   Amoxicillin Diarrhea   Latex     blister   Trazodone     Nose bleeds.    Medications: Current Outpatient Medications  Medication Sig Dispense Refill   ALPRAZolam (XANAX) 0.5 MG tablet Take 1 tablet (0.5 mg total) by mouth 2 (two) times daily as needed for anxiety. 60 tablet 0   dicyclomine (BENTYL) 10 MG capsule Take 1 capsule (10 mg total) by mouth every 12 (twelve) hours as needed (abdominal pain). 60 capsule 2   loperamide (IMODIUM) 2 MG capsule Take 2 mg by mouth 2 (two) times daily.     omeprazole (PRILOSEC) 40 MG capsule Take 1 capsule (40 mg total) by mouth daily. 90 capsule 3   colestipol (COLESTID) 1 g tablet Take 2 tablets (2 g total) by mouth daily. (Patient not taking: Reported on 01/11/2021) 180  tablet 1   fluticasone (FLONASE) 50 MCG/ACT nasal spray Place 1 spray into both nostrils daily as needed for allergies or rhinitis. (Patient not taking: Reported on 01/11/2021)     No current facility-administered medications for this visit.    Review of Systems: GENERAL: negative for malaise, night sweats HEENT: No changes in hearing or vision, no nose bleeds or other nasal problems. NECK: Negative for lumps, goiter, pain and significant neck swelling RESPIRATORY: Negative for cough, wheezing CARDIOVASCULAR: Negative for chest pain, leg swelling, palpitations, orthopnea GI: SEE HPI MUSCULOSKELETAL: Negative for joint pain or swelling, back pain, and muscle pain. SKIN: Negative for lesions, rash PSYCH: Negative for sleep disturbance, mood disorder and recent psychosocial stressors. HEMATOLOGY Negative for prolonged bleeding, bruising easily, and swollen nodes. ENDOCRINE: Negative for cold or heat intolerance, polyuria, polydipsia and goiter. NEURO: negative for tremor, gait imbalance, syncope and seizures. The remainder of the review of systems is noncontributory.   Physical Exam: BP 129/80 (BP Location: Left  Arm, Patient Position: Sitting, Cuff Size: Large)    Pulse 99    Temp 98.6 F (37 C) (Oral)    Ht 5\' 10"  (1.778 m)    Wt 159 lb 8 oz (72.3 kg)    BMI 22.89 kg/m  GENERAL: The patient is AO x3, in no acute distress. HEENT: Head is normocephalic and atraumatic. EOMI are intact. Mouth is well hydrated and without lesions. NECK: Supple. No masses LUNGS: Clear to auscultation. No presence of rhonchi/wheezing/rales. Adequate chest expansion HEART: RRR, normal s1 and s2. ABDOMEN: Soft, nontender, no guarding, no peritoneal signs, and nondistended. BS +. No masses. EXTREMITIES: Without any cyanosis, clubbing, rash, lesions or edema. NEUROLOGIC: AOx3, no focal motor deficit. SKIN: no jaundice, no rashes  Imaging/Labs: as above  I personally reviewed and interpreted the available  labs, imaging and endoscopic files.  Impression and Plan: Austin Gonzales is a 56 y.o. male with PMH melanoma of the lip s/p resection, IBS, anxiety, GERD, hyperlipidemia and alcohol abuse, who presents for follow up of IBS and GERD.  The patient has presented with some improvement of his symptoms with the use of Bentyl but is still presenting some episodes of urgency and diarrhea.  This has partially improved with Imodium and actually got worse with the use of colestipol.  We discussed about the possibility of using Viberzi as an option but the patient drinks more than 3 years on the weekends and he reported he would not be able to stop drinking like this - I advised to cut down on his alcohol intake but he is not a candidate for Viberzi.  He would benefit from intense and a low FODMAP diet and continue with Imodium as needed, he can increase his Bentyl up to every 8 hours as needed.  His GERD has also been better controlled.  He should continue the same dosage of omeprazole every day but if he presents any breakthrough episodes of heartburn he can take Pepcid as needed.  We will need to repeat an EGD in 2 years to make sure he does not have Barrett's esophagus.  -Continue omeprazole 40 mg every day. -Can take Pepcid 20 mg over-the-counter as needed for breakthrough episodes of heartburn -Explained presumed etiology of IBS symptoms. Patient was counseled about the benefit of implementing a low FODMAP to improve symptoms and recurrent episodes. A dietary list was provided to the patient. Also, the patient was counseled about the benefit of avoiding stressing situations and potential environmental triggers leading to symptomatology. -Increase Bentyl to 10 mg 3 times a day as needed -Can take Imodium as needed for breakthrough episodes of diarrhea. -Repeat EGD in 2 years and colonoscopy in 5 years.  All questions were answered.      Harvel Quale, MD Gastroenterology and  Hepatology [facility]

## 2021-01-13 ENCOUNTER — Other Ambulatory Visit (INDEPENDENT_AMBULATORY_CARE_PROVIDER_SITE_OTHER): Payer: Self-pay

## 2021-01-13 DIAGNOSIS — K580 Irritable bowel syndrome with diarrhea: Secondary | ICD-10-CM

## 2021-01-13 MED ORDER — DICYCLOMINE HCL 10 MG PO CAPS: 10.0000 mg | ORAL_CAPSULE | Freq: Three times a day (TID) | ORAL | 2 refills | Status: DC

## 2021-02-01 ENCOUNTER — Encounter (HOSPITAL_COMMUNITY): Payer: Self-pay

## 2021-02-01 NOTE — Progress Notes (Signed)
Attempted to reach patient to schedule LDCT. Unable to reach patient at this time. Detailed VM left asking that the patient return my call.

## 2021-02-05 ENCOUNTER — Ambulatory Visit (INDEPENDENT_AMBULATORY_CARE_PROVIDER_SITE_OTHER): Payer: BC Managed Care – PPO | Admitting: Nurse Practitioner

## 2021-02-05 ENCOUNTER — Encounter: Payer: Self-pay | Admitting: Nurse Practitioner

## 2021-02-05 ENCOUNTER — Ambulatory Visit: Payer: Self-pay | Admitting: Nurse Practitioner

## 2021-02-05 VITALS — BP 137/83 | HR 71 | Temp 98.4°F | Resp 20 | Ht 70.0 in | Wt 159.0 lb

## 2021-02-05 DIAGNOSIS — K582 Mixed irritable bowel syndrome: Secondary | ICD-10-CM

## 2021-02-05 NOTE — Patient Instructions (Signed)
Diet for Irritable Bowel Syndrome When you have irritable bowel syndrome (IBS), it is very important to eat the foods and follow the eating habits that are best for your condition. IBS may cause various symptoms such as pain in the abdomen, constipation, or diarrhea. Choosing the right foods can help to ease the discomfort from these symptoms. Work with your health care provider and diet and nutrition specialist (dietitian) to find the eating plan that will help to control your symptoms. What are tips for following this plan?   Keep a food diary. This will help you identify foods that cause symptoms. Write down: What you eat and when you eat it. What symptoms you have. When symptoms occur in relation to your meals, such as "pain in abdomen 2 hours after dinner." Eat your meals slowly and in a relaxed setting. Aim to eat 5-6 small meals per day. Do not skip meals. Drink enough fluid to keep your urine pale yellow. Ask your health care provider if you should take an over-the-counter probiotic to help restore healthy bacteria in your gut (digestive tract). Probiotics are foods that contain good bacteria and yeasts. Your dietitian may have specific dietary recommendations for you based on your symptoms. He or she may recommend that you: Avoid foods that cause symptoms. Talk with your dietitian about other ways to get the same nutrients that are in those problem foods. Avoid foods with gluten. Gluten is a protein that is found in rye, wheat, and barley. Eat more foods that contain soluble fiber. Examples of foods with high soluble fiber include oats, seeds, and certain fruits and vegetables. Take a fiber supplement if directed by your dietitian. Reduce or avoid certain foods called FODMAPs. These are foods that contain carbohydrates that are hard to digest. Ask your doctor which foods contain these carbohydrates. What foods are not recommended? The following are some foods and drinks that may make your  symptoms worse: Fatty foods, such as french fries. Foods that contain gluten, such as pasta and cereal. Dairy products, such as milk, cheese, and ice cream. Chocolate. Alcohol. Products with caffeine, such as coffee. Carbonated drinks, such as soda. Foods that are high in FODMAPs. These include certain fruits and vegetables. Products with sweeteners such as honey, high fructose corn syrup, sorbitol, and mannitol. The items listed above may not be a complete list of foods and beverages you should avoid. Contact a dietitian for more information. What foods are good sources of fiber? Your health care provider or dietitian may recommend that you eat more foods that contain fiber. Fiber can help to reduce constipation and other IBS symptoms. Add foods with fiber to your diet a little at a time so your body can get used to them. Too much fiber at one time might cause gas and swelling of your abdomen. The following are some foods that are good sources of fiber: Berries, such as raspberries, strawberries, and blueberries. Tomatoes. Carrots. Brown rice. Oats. Seeds, such as chia and pumpkin seeds. The items listed above may not be a complete list of recommended sources of fiber. Contact your dietitian for more options. Where to find more information International Foundation for Functional Gastrointestinal Disorders: www.iffgd.[employer] of Diabetes and Digestive and Kidney Diseases: [employer] Summary When you have irritable bowel syndrome (IBS), it is very important to eat the foods and follow the eating habits that are best for your condition. IBS may cause various symptoms such as pain in the abdomen, constipation, or diarrhea. Choosing the right  foods can help to ease the discomfort that comes from symptoms. Keep a food diary. This will help you identify foods that cause symptoms. Your health care provider or diet and nutrition specialist (dietitian) may recommend that you  eat more foods that contain fiber. This information is not intended to replace advice given to you by your health care provider. Make sure you discuss any questions you have with your health care provider. Document Revised: 09/05/2019 Document Reviewed: 09/12/2019 Elsevier Patient Education  2022 [employer].

## 2021-02-05 NOTE — Progress Notes (Signed)
° °  Subjective:    Patient ID: Austin Gonzales, male    DOB: [birth year], 56 y.o.   MRN: [id number]   Chief Complaint: stomach problems   HPI Patient comes in today c/o stomach issues. He was dx by GI with IBS after endoscopy and colonoscopy. He is on dicyclomine and that usually works well. He only has diarrhea with it. He had a flare up earlier this week and was sick for 2 days. He had to miss work. He was better yesterday and is having no issues today.    Review of Systems  Constitutional:  Negative for diaphoresis.  Eyes:  Negative for pain.  Respiratory:  Negative for shortness of breath.   Cardiovascular:  Negative for chest pain, palpitations and leg swelling.  Gastrointestinal:  Negative for abdominal pain.  Endocrine: Negative for polydipsia.  Skin:  Negative for rash.  Neurological:  Negative for dizziness, weakness and headaches.  Hematological:  Does not bruise/bleed easily.  All other systems reviewed and are negative.     Objective:   Physical Exam Vitals reviewed.  Constitutional:      Appearance: Normal appearance.  Cardiovascular:     Rate and Rhythm: Normal rate and regular rhythm.     Heart sounds: Normal heart sounds.  Pulmonary:     Effort: Pulmonary effort is normal.     Breath sounds: Normal breath sounds.  Skin:    General: Skin is warm.  Neurological:     General: No focal deficit present.     Mental Status: He is alert and oriented to person, place, and time.  Psychiatric:        Mood and Affect: Mood normal.   BP 137/83    Pulse 71    Temp 98.4 F (36.9 C) (Temporal)    Resp 20    Ht 5\' 10"  (1.778 m)    Wt 159 lb (72.1 kg)    SpO2 95%    BMI 22.81 kg/m         Assessment & Plan:   [name] in today with chief complaint of stomach problems   1. Irritable bowel syndrome with both constipation and diarrhea Watch diet to prevent flare ups Stress management Note written for work   The above assessment and management plan  was discussed with the patient. The patient verbalized understanding of and has agreed to the management plan. Patient is aware to call the clinic if symptoms persist or worsen. Patient is aware when to return to the clinic for a follow-up visit. Patient educated on when it is appropriate to go to the emergency department.   Mary-Margaret Hassell Done, FNP

## 2021-02-15 ENCOUNTER — Encounter (HOSPITAL_COMMUNITY): Payer: Self-pay

## 2021-02-15 NOTE — Progress Notes (Signed)
Attempted to reach patient regarding LCS. Unable to reach patient despite multiple attempts. Detailed VM left asking that the patient return my call. Referral closed at this time.

## 2021-02-23 DIAGNOSIS — F331 Major depressive disorder, recurrent, moderate: Secondary | ICD-10-CM | POA: Diagnosis not present

## 2021-02-23 DIAGNOSIS — F411 Generalized anxiety disorder: Secondary | ICD-10-CM | POA: Diagnosis not present

## 2021-05-21 DIAGNOSIS — F331 Major depressive disorder, recurrent, moderate: Secondary | ICD-10-CM | POA: Diagnosis not present

## 2021-05-21 DIAGNOSIS — F411 Generalized anxiety disorder: Secondary | ICD-10-CM | POA: Diagnosis not present

## 2021-06-08 ENCOUNTER — Other Ambulatory Visit (INDEPENDENT_AMBULATORY_CARE_PROVIDER_SITE_OTHER): Payer: Self-pay | Admitting: Gastroenterology

## 2021-06-08 DIAGNOSIS — K580 Irritable bowel syndrome with diarrhea: Secondary | ICD-10-CM

## 2021-07-12 ENCOUNTER — Ambulatory Visit (INDEPENDENT_AMBULATORY_CARE_PROVIDER_SITE_OTHER): Payer: BC Managed Care – PPO | Admitting: Gastroenterology

## 2021-07-12 ENCOUNTER — Encounter (INDEPENDENT_AMBULATORY_CARE_PROVIDER_SITE_OTHER): Payer: Self-pay | Admitting: Gastroenterology

## 2021-07-12 VITALS — BP 127/86 | HR 89 | Temp 98.5°F | Ht 70.0 in | Wt 166.2 lb

## 2021-07-12 DIAGNOSIS — K580 Irritable bowel syndrome with diarrhea: Secondary | ICD-10-CM | POA: Diagnosis not present

## 2021-07-12 DIAGNOSIS — K219 Gastro-esophageal reflux disease without esophagitis: Secondary | ICD-10-CM | POA: Diagnosis not present

## 2021-07-12 MED ORDER — MIRTAZAPINE 15 MG PO TABS: 15.0000 mg | ORAL_TABLET | Freq: Every day | ORAL | 1 refills | Status: DC

## 2021-07-12 NOTE — Patient Instructions (Signed)
Increase mirtazapine 15 mg every night. Continue Bentyl 3 times a day Continue omeprazole 40 mg qday

## 2021-07-12 NOTE — Progress Notes (Signed)
[name], M.D. Gastroenterology & Hepatology [facility] [address] [place], [place] [address]  Primary Care Physician: Chevis Pretty, [place] [address]  I will communicate my assessment and recommendations to the referring MD via EMR.  Problems: IBS-D GERD  History of Present Illness: Austin Gonzales is a 56 y.o. male with PMH melanoma of the lip s/p resection, IBS, anxiety, GERD, hyperlipidemia and alcohol abuse, who presents for follow up of IBS and GERD.  The patient was last seen on 01/11/2021. At that time, the patient was advised to take omeprazole 40 mg every day and Pepcid 20 mg at night.  Also advised to follow a low fiber diet and to increase Bentyl to 10 mg 3 times daily, as well as Imodium as needed.  Patient states he is still having diarrhea. States he is having soft bowel movements 2-3 times in the morning and one time at night. He is taking the standing Bentyl and also taking Imodium 1-2 pills per day. Has tried to change his diet significantly but has not seen any improvement with this. Has had some abdominal discomfort when he "holds his stool until he can find a toilet". Has noticed his diarrhea is more significant in the morning than in the evenings.He has had some nighttime symptoms. He is currently on mirtazapine 7.5 mg every night for insomnia.  He is currently taking omeprazole 40 mg qday which is controlling his GERD. He states that very seldom he has issues with heartburn, for which occasionally he has to take OTC 20 mg omeprazole. Denies any heartburn episodes.  The patient denies having any nausea, vomiting, fever, chills, hematochezia, melena, hematemesis, abdominal distention, abdominal pain,  jaundice, pruritus or weight loss.  Last EGD:  11/06/2020, there were 2 tongues of salmon-colored mucosa extending between 38 to 40 cm which were biopsied but were  negative for BE, consistent with chronic inflammation.  Stomach and duodenum were normal.  Biopsies were negative for celiac disease.   Recommended to repeat in 2 years.   Last Colonoscopy: 11/06/2020, normal terminal ileum, there were 3 small polyps in the transverse colon, ascending colon and cecum which were resected with cold snare (pathology tubular adenoma x3), diverticulosis.  Random colonic biopsies were negative for microscopic colitis.   Recommended to repeat in 5 years.  Past Medical History: Past Medical History:  Diagnosis Date   Anemia    Anxiety    GERD (gastroesophageal reflux disease)    Hyperlipidemia     Past Surgical History: Past Surgical History:  Procedure Laterality Date   BIOPSY  11/06/2020   Procedure: BIOPSY;  Surgeon: Montez Morita, Quillian Quince, MD;  [facility];  Service: Gastroenterology;;  small bowel, GE junction,random colon biopsies   COLONOSCOPY N/A 06/26/2013   Procedure: COLONOSCOPY;  Surgeon: Rogene Houston, MD;  [facility]: [facility];  Service: Endoscopy;  Laterality: N/A;  1200   COLONOSCOPY WITH PROPOFOL N/A 11/06/2020   Procedure: COLONOSCOPY WITH PROPOFOL;  Surgeon: Harvel Quale, MD;  [facility]: [facility];  Service: Gastroenterology;  Laterality: N/A;  9:20   ESOPHAGOGASTRODUODENOSCOPY (EGD) WITH PROPOFOL N/A 11/06/2020   Procedure: ESOPHAGOGASTRODUODENOSCOPY (EGD) WITH PROPOFOL;  Surgeon: Harvel Quale, MD;  [facility];  Service: Gastroenterology;  Laterality: N/A;   FRACTURE SURGERY Right    Jaw   HERNIA REPAIR     umbilical   POLYPECTOMY  11/06/2020   Procedure: POLYPECTOMY;  Surgeon: Montez Morita,  Quillian Quince, MD;  [facility]: [facility];  Service: Gastroenterology;;  cecal;ascending;    Family History: Family History  Problem Relation Age of Onset   Cancer Mother        breast   Heart disease Mother    Hyperlipidemia Mother    Atrial fibrillation Mother    COPD Father     Asthma Father     Social History: Social History   Tobacco Use  Smoking Status Every Day   Packs/day: 0.50   Years: 15.00   Total pack years: 7.50   Types: Cigarettes  Smokeless Tobacco Never  Tobacco Comments   1 pack a day x 15 yrs   Social History   Substance and Sexual Activity  Alcohol Use Yes   Comment: drinks 6 beers on weekend   Social History   Substance and Sexual Activity  Drug Use No    Allergies: Allergies  Allergen Reactions   Amoxicillin Diarrhea   Latex     blister   Trazodone     Nose bleeds.    Medications: Current Outpatient Medications  Medication Sig Dispense Refill   ALPRAZolam (XANAX) 0.5 MG tablet Take 1 tablet (0.5 mg total) by mouth 2 (two) times daily as needed for anxiety. 60 tablet 0   bismuth subsalicylate (PEPTO BISMOL) 262 MG/15ML suspension Take 30 mLs by mouth every 6 (six) hours as needed.     dicyclomine (BENTYL) 10 MG capsule TAKE ONE CAPSULE THREE TIMES DAILY BEFORE MEALS 90 capsule 2   fluticasone (FLONASE) 50 MCG/ACT nasal spray Place 1 spray into both nostrils daily as needed for allergies or rhinitis.     loperamide (IMODIUM) 2 MG capsule Take 2 mg by mouth 2 (two) times daily.     mirtazapine (REMERON) 7.5 MG tablet Take 7.5 mg by mouth at bedtime.     Multiple Vitamin (MULTIVITAMIN) capsule Take 1 capsule by mouth daily.     omeprazole (PRILOSEC) 20 MG capsule Take 20 mg by mouth daily. prn     omeprazole (PRILOSEC) 40 MG capsule Take 1 capsule (40 mg total) by mouth daily. 90 capsule 3   No current facility-administered medications for this visit.    Review of Systems: GENERAL: negative for malaise, night sweats HEENT: No changes in hearing or vision, no nose bleeds or other nasal problems. NECK: Negative for lumps, goiter, pain and significant neck swelling RESPIRATORY: Negative for cough, wheezing CARDIOVASCULAR: Negative for chest pain, leg swelling, palpitations, orthopnea GI: SEE HPI MUSCULOSKELETAL:  Negative for joint pain or swelling, back pain, and muscle pain. SKIN: Negative for lesions, rash PSYCH: Negative for sleep disturbance, mood disorder and recent psychosocial stressors. HEMATOLOGY Negative for prolonged bleeding, bruising easily, and swollen nodes. ENDOCRINE: Negative for cold or heat intolerance, polyuria, polydipsia and goiter. NEURO: negative for tremor, gait imbalance, syncope and seizures. The remainder of the review of systems is noncontributory.   Physical Exam: BP 127/86 (BP Location: Left Arm, Patient Position: Sitting, Cuff Size: Large)   Pulse 89   Temp 98.5 F (36.9 C) (Oral)   Ht '5\' 10"'$  (1.778 m)   Wt 166 lb 3.2 oz (75.4 kg)   BMI 23.85 kg/m  GENERAL: The patient is AO x3, in no acute distress. HEENT: Head is normocephalic and atraumatic. EOMI are intact. Mouth is well hydrated and without lesions. NECK: Supple. No masses LUNGS: Clear to auscultation. No presence of rhonchi/wheezing/rales. Adequate chest expansion HEART: RRR, normal s1 and s2. ABDOMEN: mildly tender in the epigastric area, no guarding,  no peritoneal signs, and nondistended. BS +. No masses. EXTREMITIES: Without any cyanosis, clubbing, rash, lesions or edema. NEUROLOGIC: AOx3, no focal motor deficit. SKIN: no jaundice, no rashes  Imaging/Labs: as above  I personally reviewed and interpreted the available labs, imaging and endoscopic files.  Impression and Plan: JONY LADNIER is a 56 y.o. male with PMH melanoma of the lip s/p resection, IBS, anxiety, GERD, hyperlipidemia and alcohol abuse, who presents for follow up of IBS and GERD.  The patient has presented chronic symptoms of diarrhea with an extensive work-up including cross-sectional abdominal imaging and endoscopic investigations that have been completely normal.  Given the chronicity of his symptoms, this is likely related to IBS-D which has partially responded to standing Bentyl.  He has been taking mirtazapine for management  of insomnia and very low-dose.  We discussed about the possibility of increasing the dose of mirtazapine to 50 mg every night and continuing Bentyl for now.  He is still drinking alcohol frequently and I advised him that if he were to start Viberzi in the future he needs to stop drinking alcohol down to less than 3 drinks per week.  He understood and agreed.  Regarding his GERD, this has been well controlled on omeprazole 40 mg every day, he can still take rescue dose of omeprazole 20 mg if he presents any more episodes of heartburn.  - Increase mirtazapine 15 mg every night. - Continue Bentyl 3 times a day - Continue omeprazole 40 mg qday  All questions were answered.      Harvel Quale, MD Gastroenterology and Hepatology [facility]

## 2021-08-24 DIAGNOSIS — F331 Major depressive disorder, recurrent, moderate: Secondary | ICD-10-CM | POA: Diagnosis not present

## 2021-08-24 DIAGNOSIS — F411 Generalized anxiety disorder: Secondary | ICD-10-CM | POA: Diagnosis not present

## 2021-09-22 ENCOUNTER — Other Ambulatory Visit (INDEPENDENT_AMBULATORY_CARE_PROVIDER_SITE_OTHER): Payer: Self-pay | Admitting: Gastroenterology

## 2021-09-22 DIAGNOSIS — K219 Gastro-esophageal reflux disease without esophagitis: Secondary | ICD-10-CM

## 2021-10-20 DIAGNOSIS — F332 Major depressive disorder, recurrent severe without psychotic features: Secondary | ICD-10-CM | POA: Diagnosis not present

## 2021-10-20 DIAGNOSIS — F1020 Alcohol dependence, uncomplicated: Secondary | ICD-10-CM | POA: Diagnosis not present

## 2021-10-20 DIAGNOSIS — F411 Generalized anxiety disorder: Secondary | ICD-10-CM | POA: Diagnosis not present

## 2021-10-25 ENCOUNTER — Other Ambulatory Visit (INDEPENDENT_AMBULATORY_CARE_PROVIDER_SITE_OTHER): Payer: Self-pay | Admitting: Gastroenterology

## 2021-10-25 DIAGNOSIS — K580 Irritable bowel syndrome with diarrhea: Secondary | ICD-10-CM

## 2021-10-26 ENCOUNTER — Ambulatory Visit: Payer: BC Managed Care – PPO | Admitting: Nurse Practitioner

## 2021-10-26 ENCOUNTER — Encounter: Payer: Self-pay | Admitting: Nurse Practitioner

## 2021-10-26 VITALS — BP 128/87 | HR 73 | Temp 98.4°F | Resp 20 | Ht 70.0 in | Wt 160.0 lb

## 2021-10-26 DIAGNOSIS — A084 Viral intestinal infection, unspecified: Secondary | ICD-10-CM | POA: Diagnosis not present

## 2021-10-26 DIAGNOSIS — R112 Nausea with vomiting, unspecified: Secondary | ICD-10-CM

## 2021-10-26 DIAGNOSIS — K591 Functional diarrhea: Secondary | ICD-10-CM

## 2021-10-26 MED ORDER — ONDANSETRON HCL 4 MG PO TABS: 4.0000 mg | ORAL_TABLET | Freq: Three times a day (TID) | ORAL | 0 refills | Status: DC | PRN

## 2021-10-26 NOTE — Progress Notes (Signed)
Subjective:    Patient ID: Austin Gonzales, male    DOB: Oct 19, 1965, 56 y.o.   MRN: [id number]   Chief Complaint: Diarrhea and Vomiting   Pt seen today for N/V/D since Friday.  Diarrhea  This is a new problem. The current episode started in the past 7 days (started Friday). The problem occurs 5 to 10 times per day. The problem has been unchanged. The stool consistency is described as Watery. The patient states that diarrhea does not awaken him from sleep. Associated symptoms include abdominal pain and vomiting. Pertinent negatives include no chills, fever, headaches or myalgias. Nothing aggravates the symptoms. There are no known risk factors. He has tried bismuth subsalicylate and anti-motility drug for the symptoms. The treatment provided no relief. His past medical history is significant for irritable bowel syndrome.       Review of Systems  Constitutional:  Negative for chills and fever.  Respiratory:  Negative for chest tightness and shortness of breath.   Cardiovascular:  Negative for chest pain.  Gastrointestinal:  Positive for abdominal pain, diarrhea, nausea and vomiting.       Unable to keep anything down   Genitourinary:  Negative for difficulty urinating and dysuria.  Musculoskeletal:  Negative for myalgias.  Skin:  Negative for rash.  Neurological:  Negative for dizziness and headaches.  All other systems reviewed and are negative.      Objective:   Physical Exam Vitals and nursing note reviewed.  Constitutional:      Appearance: Normal appearance.  HENT:     Head: Normocephalic and atraumatic.     Right Ear: Tympanic membrane normal.     Left Ear: Tympanic membrane normal.     Nose: Nose normal.     Mouth/Throat:     Mouth: Mucous membranes are moist.     Pharynx: Oropharynx is clear.  Eyes:     Conjunctiva/sclera: Conjunctivae normal.     Pupils: Pupils are equal, round, and reactive to light.  Cardiovascular:     Rate and Rhythm: Normal rate and  regular rhythm.     Pulses: Normal pulses.     Heart sounds: Normal heart sounds.  Pulmonary:     Effort: Pulmonary effort is normal. No respiratory distress.     Breath sounds: Normal breath sounds. No wheezing, rhonchi or rales.  Abdominal:     General: Bowel sounds are increased. There is no distension.     Palpations: Abdomen is soft. There is no hepatomegaly, splenomegaly or mass.     Tenderness: There is no abdominal tenderness.  Musculoskeletal:     Cervical back: Normal range of motion.  Lymphadenopathy:     Cervical: No cervical adenopathy.  Skin:    General: Skin is warm and dry.     Capillary Refill: Capillary refill takes less than 2 seconds.  Neurological:     General: No focal deficit present.     Mental Status: He is alert and oriented to person, place, and time.  Psychiatric:        Mood and Affect: Mood normal.        Behavior: Behavior normal.        Thought Content: Thought content normal.        Judgment: Judgment normal.   BP 128/87   Pulse 73   Temp 98.4 F (36.9 C) (Temporal)   Resp 20   Ht '5\' 10"'$  (1.778 m)   Wt 160 lb (72.6 kg)   SpO2 97%   BMI  22.96 kg/m          Assessment & Plan:  Nazair C Welton in today with chief complaint of Diarrhea and Vomiting   1. Nausea and vomiting, unspecified vomiting type 2. Functional diarrhea 3. Viral gastroenteritis First 24 Hours-Clear liquids  popsicles  Jello  gatorade  Sprite Second 24 hours-Add Full liquids ( Liquids you cant see through) Third 24 hours- Bland diet ( foods that are baked or broiled)  *avoiding fried foods and highly spiced foods* During these 3 days  Avoid milk, cheese, ice cream or any other dairy products  Avoid caffeine- REMEMBER Mt. Dew and Mello Yellow contain lots of caffeine You should eat and drink in  Frequent small volumes If no improvement in symptoms or worsen in 2-3 days should RETRUN TO OFFICE or go to ER!   Imodium AD OTC for diarrhea Meds ordered this  encounter  Medications   ondansetron (ZOFRAN) 4 MG tablet    Sig: Take 1 tablet (4 mg total) by mouth every 8 (eight) hours as needed for nausea or vomiting.    Dispense:  20 tablet    Refill:  0    Order Specific Question:   Supervising Provider    Answer:   Caryl Pina A [[id number]]       The above assessment and management plan was discussed with the patient. The patient verbalized understanding of and has agreed to the management plan. Patient is aware to call the clinic if symptoms persist or worsen. Patient is aware when to return to the clinic for a follow-up visit. Patient educated on when it is appropriate to go to the emergency department.   Mary-Margaret Hassell Done, FNP

## 2021-10-26 NOTE — Patient Instructions (Signed)
Diarrhea, Adult Diarrhea is when you pass loose and watery poop (stool) often. Diarrhea can make you feel weak and cause you to lose water in your body (get dehydrated). Losing water in your body can cause you to: Feel tired and thirsty. Have a dry mouth. Go pee (urinate) less often. Diarrhea often lasts 2-3 days. However, it can last longer if it is a sign of something more serious. It is important to treat your diarrhea as told by your doctor. Follow these instructions at home: Eating and drinking     Follow these instructions as told by your doctor: Take an ORS (oral rehydration solution). This is a drink that helps you replace fluids and minerals your body lost. It is sold at pharmacies and stores. Drink plenty of fluids, such as: Water. Ice chips. Diluted fruit juice. Low-calorie sports drinks. Milk, if you want. Avoid drinking fluids that have a lot of sugar or caffeine in them. Eat bland, easy-to-digest foods in small amounts as you are able. These foods include: Bananas. Applesauce. Rice. Low-fat (lean) meats. Toast. Crackers. Avoid alcohol. Avoid spicy or fatty foods.  Medicines Take over-the-counter and prescription medicines only as told by your doctor. If you were prescribed an antibiotic medicine, take it as told by your doctor. Do not stop using the antibiotic even if you start to feel better. General instructions  Wash your hands often using soap and water. If soap and water are not available, use a hand sanitizer. Others in your home should wash their hands as well. Hands should be washed: After using the toilet or changing a diaper. Before preparing, cooking, or serving food. While caring for a sick person. While visiting someone in a hospital. Drink enough fluid to keep your pee (urine) pale yellow. Rest at home while you get better. Take a warm bath to help with any burning or pain from having diarrhea. Watch your condition for any changes. Keep all  follow-up visits as told by your doctor. This is important. Contact a doctor if: You have a fever. Your diarrhea gets worse. You have new symptoms. You cannot keep fluids down. You feel light-headed or dizzy. You have a headache. You have muscle cramps. Get help right away if: You have chest pain. You feel very weak or you pass out (faint). You have bloody or black poop or poop that looks like tar. You have very bad pain, cramping, or bloating in your belly (abdomen). You have trouble breathing or you are breathing very quickly. Your heart is beating very quickly. Your skin feels cold and clammy. You feel confused. You have signs of losing too much water in your body, such as: Dark pee, very little pee, or no pee. Cracked lips. Dry mouth. Sunken eyes. Sleepiness. Weakness. Summary Diarrhea is when you pass loose and watery poop (stool) often. Diarrhea can make you feel weak and cause you to lose water in your body (get dehydrated). Take an ORS (oral rehydration solution). This is a drink that is sold at pharmacies and stores. Eat bland, easy-to-digest foods in small amounts as you are able. Contact a doctor if your condition gets worse. Get help right away if you have signs that you have lost too much water in your body. This information is not intended to replace advice given to you by your health care provider. Make sure you discuss any questions you have with your health care provider. Document Revised: 04/02/2021 Document Reviewed: 07/22/2020 Elsevier Patient Education  [place].

## 2021-11-15 DIAGNOSIS — F411 Generalized anxiety disorder: Secondary | ICD-10-CM | POA: Diagnosis not present

## 2021-11-15 DIAGNOSIS — F331 Major depressive disorder, recurrent, moderate: Secondary | ICD-10-CM | POA: Diagnosis not present

## 2021-12-04 ENCOUNTER — Encounter (INDEPENDENT_AMBULATORY_CARE_PROVIDER_SITE_OTHER): Payer: Self-pay | Admitting: Gastroenterology

## 2021-12-14 ENCOUNTER — Telehealth: Payer: Self-pay | Admitting: Gastroenterology

## 2021-12-14 NOTE — Telephone Encounter (Signed)
Good Afternoon Dr. Candis Schatz,  Supervising MD for today AM  Patient called this morning stating that he wanted to make an appointment at our office due to wanting to transfer his care from [facility] because he is not happy with his care. Patient was last seen by [facility] in June of 2023 and his last colonoscopy and endoscopy's were in October of 2022. Patient is needing to be seen for diarrhea and nausea. Patients records are in epic, will you please review and advise on scheduling?  Thank you.

## 2021-12-24 ENCOUNTER — Encounter: Payer: Self-pay | Admitting: Gastroenterology

## 2021-12-24 NOTE — Telephone Encounter (Signed)
LVM for patient to call back to schedule OV with Dr. Candis Schatz.

## 2022-01-12 ENCOUNTER — Encounter: Payer: Self-pay | Admitting: Gastroenterology

## 2022-01-12 ENCOUNTER — Ambulatory Visit (INDEPENDENT_AMBULATORY_CARE_PROVIDER_SITE_OTHER): Payer: BC Managed Care – PPO | Admitting: Gastroenterology

## 2022-01-12 ENCOUNTER — Other Ambulatory Visit (INDEPENDENT_AMBULATORY_CARE_PROVIDER_SITE_OTHER): Payer: BC Managed Care – PPO

## 2022-01-12 VITALS — BP 112/72 | HR 80 | Ht 70.0 in | Wt 170.0 lb

## 2022-01-12 DIAGNOSIS — K219 Gastro-esophageal reflux disease without esophagitis: Secondary | ICD-10-CM

## 2022-01-12 DIAGNOSIS — K580 Irritable bowel syndrome with diarrhea: Secondary | ICD-10-CM

## 2022-01-12 DIAGNOSIS — Z789 Other specified health status: Secondary | ICD-10-CM

## 2022-01-12 LAB — CBC WITH DIFFERENTIAL/PLATELET
Basophils Absolute: 0.1 10*3/uL (ref 0.0–0.1)
Basophils Relative: 0.8 % (ref 0.0–3.0)
Eosinophils Absolute: 0 10*3/uL (ref 0.0–0.7)
Eosinophils Relative: 0.6 % (ref 0.0–5.0)
HCT: 42.3 % (ref 39.0–52.0)
Hemoglobin: 14.3 g/dL (ref 13.0–17.0)
Lymphocytes Relative: 25.9 % (ref 12.0–46.0)
Lymphs Abs: 1.8 10*3/uL (ref 0.7–4.0)
MCHC: 33.8 g/dL (ref 30.0–36.0)
MCV: 103.4 fl — ABNORMAL HIGH (ref 78.0–100.0)
Monocytes Absolute: 1 10*3/uL (ref 0.1–1.0)
Monocytes Relative: 14.1 % — ABNORMAL HIGH (ref 3.0–12.0)
Neutro Abs: 3.9 10*3/uL (ref 1.4–7.7)
Neutrophils Relative %: 58.6 % (ref 43.0–77.0)
Platelets: 255 10*3/uL (ref 150.0–400.0)
RBC: 4.09 Mil/uL — ABNORMAL LOW (ref 4.22–5.81)
RDW: 12.8 % (ref 11.5–15.5)
WBC: 6.7 10*3/uL (ref 4.0–10.5)

## 2022-01-12 LAB — COMPREHENSIVE METABOLIC PANEL
ALT: 12 U/L (ref 0–53)
AST: 18 U/L (ref 0–37)
Albumin: 4.6 g/dL (ref 3.5–5.2)
Alkaline Phosphatase: 49 U/L (ref 39–117)
BUN: 6 mg/dL (ref 6–23)
CO2: 25 mEq/L (ref 19–32)
Calcium: 9.3 mg/dL (ref 8.4–10.5)
Chloride: 101 mEq/L (ref 96–112)
Creatinine, Ser: 1.03 mg/dL (ref 0.40–1.50)
GFR: 80.47 mL/min (ref >60.00)
Glucose, Bld: 97 mg/dL (ref 70–99)
Potassium: 3.5 mEq/L (ref 3.5–5.1)
Sodium: 138 mEq/L (ref 135–145)
Total Bilirubin: 0.6 mg/dL (ref 0.2–1.2)
Total Protein: 7.8 g/dL (ref 6.0–8.3)

## 2022-01-12 LAB — IBC + FERRITIN
Ferritin: 121.1 ng/mL (ref 22.0–322.0)
Iron: 150 ug/dL (ref 42–165)
Saturation Ratios: 42.2 % (ref 20.0–50.0)
TIBC: 355.6 ug/dL (ref 250.0–450.0)
Transferrin: 254 mg/dL (ref 212.0–360.0)

## 2022-01-12 LAB — FOLATE: Folate: >23.8 ng/mL (ref >5.9)

## 2022-01-12 LAB — VITAMIN B12: Vitamin B-12: 219 pg/mL (ref 211–911)

## 2022-01-12 LAB — GAMMA GT: GGT: 87 U/L — ABNORMAL HIGH (ref 7–51)

## 2022-01-12 NOTE — Patient Instructions (Addendum)
_______________________________________________________  If you are age 57 or older, your body mass index should be between 23-30. Your Body mass index is 24.39 kg/m. If this is out of the aforementioned range listed, please consider follow up with your Primary Care Provider.  If you are age 33 or younger, your body mass index should be between 19-25. Your Body mass index is 24.39 kg/m. If this is out of the aformentioned range listed, please consider follow up with your Primary Care Provider.  Take Xifaxan three times daily for 14 days.    Your provider has requested that you go to the basement level for lab work before leaving today. Press "B" on the elevator. The lab is located at the first door on the left as you exit the elevator.  You will be contacted by [place] in the next 2 days to arrange a Ultrasound.  The number on your caller ID will be [phone], please answer when they call.  If you have not heard from them in 2 days please call [phone] to schedule.    The [facility] providers would like to encourage you to use [facility] to communicate with providers for non-urgent requests or questions.  Due to long hold times on the telephone, sending your provider a message by [employer] may be a faster and more efficient way to get a response.  Please allow 48 business hours for a response.  Please remember that this is for non-urgent requests.   Due to recent changes in healthcare laws, you may see the results of your imaging and laboratory studies on MyChart before your provider has had a chance to review them.  We understand that in some cases there may be results that are confusing or concerning to you. Not all laboratory results come back in the same time frame and the provider may be waiting for multiple results in order to interpret others.  Please give [place] 48 hours in order for your provider to thoroughly review all the results before contacting the office for  clarification of your results.   It was a pleasure to see you today!  Thank you for trusting me with your gastrointestinal care!    Scott E.Candis Schatz, MD

## 2022-01-12 NOTE — Progress Notes (Signed)
HPI : Austin Gonzales is a very pleasant 56 year old male with a history of melanoma, anxiety, GERD and IBS who is referred to [place] by Chevis Pretty, FNP for ongoing management of chronic GERD and IBS symptoms.  The patient was previously seen by Dr. Jenetta Downer in [place].  He underwent an upper and lower endoscopy in October of last year.  The upper endoscopy was notable for 2 tongues of salmon-colored mucosa, with biopsies showing chronic inflammation, no evidence of intestinal metaplasia.  Duodenal biopsies negative for celiac disease.  A colonoscopy showed normal terminal ileum and 3 small polyps, all tubular adenomas as well as diverticulosis.  Random colon biopsies were negative for microscopic colitis.  He was recommended repeat colonoscopy in 5 years.  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  He was recommended to start Viberzi, but due to his excessive alcohol use this was relatively contraindicated and so was not started.  His GERD has been treated with omeprazole 40 mg daily.   Today, he reports that his GERD symptoms are pretty well controlled and have not been particularly bothersome as of late.  His diarrhea, however, has been very bothersome and disruptive to his quality of life.  He has had to call out of work several times due to his diarrhea.  He goes through periods where his diarrhea is not as bad, and periods where it is 'killing me'.  He very rarely has any solid stools.  Stools are typically loose or watery.  He thinks it has been over a month since he had a solid stool.  He typically has at least 3 bowel movements per day, but will have 7-8 on a bad day.  He occasionally has nocturnal stools, but this is not frequent.  He often has crampy abdominal pain which precedes the urge to defecate.  Sometimes has abdominal pain in the absence of bowel movements.  He has urgency with most bowel movements, but has not had incontinence.  He has had  episodes where he has had to urgently pull over while driving to defecate.  He currently takes Imodium daily, typically 1-2 tablets in the morning.  He has been doing this for years.  He takes Bentyl daily before meals.  He had been on mirtazepine for depression but he felt like this made his diarrhea worse.  He previously took buspirone for smoking cessation but says it 'tore my stomach up'.  He has not tried any empiric dietary changes, but he does avoid milk and ice cream.  He tolerates cheese in small amounts.  He admits he does not eat a very healthy diet.   He continues to struggle with anxiety and depression.  He is followed closely by Behavioral Health through [facility], every 2-3 months.  He has been on a stable dose of Paxil for a few years.  He has a history of heavy alcohol use, but reports drinking only on the weekends currently.  On Friday and Saturday, he will typically have 6 beers both days.  He is divorced and lives alone.  He has a son and daughter who live in [place] ([place] and [place]).     Past Medical History:  Diagnosis Date   Anemia    Anxiety    GERD (gastroesophageal reflux disease)    Hyperlipidemia      Past Surgical History:  Procedure Laterality Date   BIOPSY  11/06/2020   Procedure: BIOPSY;  Surgeon: Harvel Quale, MD;  [facility];  Service: Gastroenterology;;  small bowel, GE junction,random colon biopsies   COLONOSCOPY N/A 06/26/2013   Procedure: COLONOSCOPY;  Surgeon: Rogene Houston, MD;  [facility];  Service: Endoscopy;  Laterality: N/A;  1200   COLONOSCOPY WITH PROPOFOL N/A 11/06/2020   Procedure: COLONOSCOPY WITH PROPOFOL;  Surgeon: Harvel Quale, MD;  [facility];  Service: Gastroenterology;  Laterality: N/A;  9:20   ESOPHAGOGASTRODUODENOSCOPY (EGD) WITH PROPOFOL N/A 11/06/2020   Procedure: ESOPHAGOGASTRODUODENOSCOPY (EGD) WITH PROPOFOL;  Surgeon: Harvel Quale, MD;  [facility];  Service: Gastroenterology;  Laterality: N/A;   FRACTURE SURGERY Right    Jaw   HERNIA REPAIR     umbilical   POLYPECTOMY  11/06/2020   Procedure: POLYPECTOMY;  Surgeon: Montez Morita, Quillian Quince, MD;  [facility];  Service: Gastroenterology;;  cecal;ascending;   Family History  Problem Relation Age of Onset   Cancer Mother        breast   Heart disease Mother    Hyperlipidemia Mother    Atrial fibrillation Mother    COPD Father    Asthma Father    Social History   Tobacco Use   Smoking status: Every Day    Packs/day: 0.50    Years: 15.00    Total pack years: 7.50    Types: Cigarettes   Smokeless tobacco: Never   Tobacco comments:    1 pack a day x 15 yrs  Vaping Use   Vaping Use: Never used  Substance Use Topics   Alcohol use: Yes    Comment: drinks 6 beers on weekend   Drug use: No   Current Outpatient Medications  Medication Sig Dispense Refill   ALPRAZolam (XANAX) 0.5 MG tablet Take 1 tablet (0.5 mg total) by mouth 2 (two) times daily as needed for anxiety. 60 tablet 0   bismuth subsalicylate (PEPTO BISMOL) 262 MG/15ML suspension Take 30 mLs by mouth every 6 (six) hours as needed.     dicyclomine (BENTYL) 10 MG capsule TAKE ONE CAPSULE THREE TIMES DAILY BEFORE MEALS 90 capsule 2   fluticasone (FLONASE) 50 MCG/ACT nasal spray Place 1 spray into both nostrils daily as needed for allergies or rhinitis.     loperamide (IMODIUM) 2 MG capsule Take 2 mg by mouth 2 (two) times daily.     mirtazapine (REMERON) 15 MG tablet Take 1 tablet (15 mg total) by mouth at bedtime. 90 tablet 1   Multiple Vitamin (MULTIVITAMIN) capsule Take 1 capsule by mouth daily.     omeprazole (PRILOSEC) 40 MG capsule Take 1 capsule by mouth once daily 90 capsule 0   ondansetron (ZOFRAN) 4 MG tablet Take 1 tablet (4 mg total) by mouth every 8 (eight) hours as needed for nausea or vomiting. 20 tablet 0   No current facility-administered medications  for this visit.   Allergies  Allergen Reactions   Amoxicillin Diarrhea   Latex     blister   Trazodone     Nose bleeds.     Review of Systems: All systems reviewed and negative except where noted in HPI.    No results found.  Physical Exam: BP 112/72   Pulse 80   Ht '5\' 10"'$  (1.778 m)   Wt 170 lb (77.1 kg)   BMI 24.39 kg/m  Constitutional: Pleasant,well-developed, Caucasian male in no acute distress. HEENT: Normocephalic and atraumatic. Conjunctivae are normal. No scleral icterus. Cardiovascular: Normal rate, regular rhythm.  Pulmonary/chest: Effort normal and breath sounds  normal. No wheezing, rales or rhonchi. Abdominal: Soft, nondistended, nontender. Bowel sounds active throughout. There are no masses palpable. No hepatomegaly. Extremities: no edema Neurological: Alert and oriented to person place and time. Skin: Skin is warm and dry. No rashes noted. Psychiatric: Normal mood and affect. Behavior is normal.  CBC    Component Value Date/Time   WBC 6.4 08/04/2020 1603   WBC 8.9 04/04/2014 1701   RBC 3.33 (L) 08/04/2020 1603   RBC 4.04 (A) 04/04/2014 1701   HGB 12.3 (L) 08/04/2020 1603   HCT 34.7 (L) 08/04/2020 1603   PLT 194 08/04/2020 1603   MCV 104 (H) 08/04/2020 1603   MCH 36.9 (H) 08/04/2020 1603   MCH 31.1 04/04/2014 1701   MCHC 35.4 08/04/2020 1603   MCHC 31.5 (A) 04/04/2014 1701   RDW 11.6 08/04/2020 1603   LYMPHSABS 1.7 08/04/2020 1603   EOSABS 0.2 08/04/2020 1603   BASOSABS 0.1 08/04/2020 1603    CMP     Component Value Date/Time   NA 141 08/04/2020 1603   K 4.1 08/04/2020 1603   CL 100 08/04/2020 1603   CO2 24 08/04/2020 1603   GLUCOSE 93 08/04/2020 1603   GLUCOSE 97 07/06/2012 1647   BUN 5 (L) 08/04/2020 1603   CREATININE 0.86 08/04/2020 1603   CREATININE 0.98 07/06/2012 1647   CALCIUM 9.4 08/04/2020 1603   PROT 7.0 08/04/2020 1603   ALBUMIN 4.6 08/04/2020 1603   AST 28 08/04/2020 1603   ALT 18 08/04/2020 1603   ALKPHOS 56 08/04/2020  1603   BILITOT 0.3 08/04/2020 1603   GFRNONAA 89 02/17/2017 1454   GFRAA 103 02/17/2017 1454     ASSESSMENT AND PLAN: 56 year old male with GERD and diarrhea-predominant IBS here to establish care.  His GERD symptoms are currently well controlled with once daily omeprazole.  His diarrhea and urgency are very bothersome.  We discussed the proposed pathophysiology of IBS and gut brain axis disorders in general.  We discussed management of IBS, to include use of medications to improve bowel habits, as needed pain medicine, centrally acting neuromodulators, role of empiric dietary modifications to include a low FODMAP diet gluten-free diet, as well as the role of cognitive therapies.  We discussed the goals of IBS management, namely to minimize the impact of GI symptoms on quality of life.  He does not think he will be able to adhere to any drastic dietary changes such as a low FODMAP or gluten free diet. Although his current reported alcohol use would likely be acceptable for Viberzi, given his history of heavier alcohol use, I would prefer to try rifaximin at this time.  We also discussed starting a TCA, but given his difficulties with anxiety and depression, I would not want to start this without discussion with his behavioral health team. Given his chronic alcohol use, I would like to assess for evidence of chronic liver disease with an [place] and get updated labs (CBC, CMP, GGT).  IBS, diarrhea predominant - Rifaximin 550 mg TID x 14 days - Consider TCA at next visit - discussed diets  GERD - Continue once daily omeprazole  Alcohol use disorder - CBC, CMP, GGT - Right upper quadrant ultrasound  Taydem Cavagnaro E. Candis Schatz, MD [facility]   CC:  Hassell Done, Mary-Margaret, *

## 2022-01-19 NOTE — Progress Notes (Signed)
Austin Gonzales,  Overall your labs looked pretty good.  Your GGT level was elevated, which is a good indicator of liver injury from alcohol.  In addition your MCV was elevated (large red blood cells) which is a reaction of the bone marrow to excessive alcohol use.  Your B12 levels were almost below normal levels.  Please complete the ultrasound of the abdomen as discussed.  I recommend you cut back on your drinking to reduce risk of further liver injury.

## 2022-01-27 ENCOUNTER — Ambulatory Visit (HOSPITAL_COMMUNITY): Admission: RE | Admit: 2022-01-27 | Payer: BC Managed Care – PPO | Source: Ambulatory Visit

## 2022-02-14 DIAGNOSIS — F331 Major depressive disorder, recurrent, moderate: Secondary | ICD-10-CM | POA: Diagnosis not present

## 2022-02-14 DIAGNOSIS — F411 Generalized anxiety disorder: Secondary | ICD-10-CM | POA: Diagnosis not present

## 2022-02-16 ENCOUNTER — Encounter: Payer: Self-pay | Admitting: Family Medicine

## 2022-02-16 ENCOUNTER — Telehealth: Payer: Self-pay | Admitting: Gastroenterology

## 2022-02-16 ENCOUNTER — Other Ambulatory Visit: Payer: Self-pay

## 2022-02-16 ENCOUNTER — Telehealth: Payer: BC Managed Care – PPO | Admitting: Family Medicine

## 2022-02-16 DIAGNOSIS — K648 Other hemorrhoids: Secondary | ICD-10-CM | POA: Diagnosis not present

## 2022-02-16 MED ORDER — ONDANSETRON HCL 4 MG PO TABS: 4.0000 mg | ORAL_TABLET | Freq: Three times a day (TID) | ORAL | 3 refills | Status: DC | PRN

## 2022-02-16 MED ORDER — HYDROCORTISONE ACETATE 25 MG RE SUPP: 25.0000 mg | Freq: Two times a day (BID) | RECTAL | 0 refills | Status: DC

## 2022-02-16 NOTE — Progress Notes (Signed)
Virtual Visit via MyChart Video Note Due to COVID-19 pandemic this visit was conducted virtually. This visit type was conducted due to national recommendations for restrictions regarding the COVID-19 Pandemic (e.g. social distancing, sheltering in place) in an effort to limit this patient's exposure and mitigate transmission in our community. All issues noted in this document were discussed and addressed.  A physical exam was not performed with this format.   I connected with Austin Gonzales on 02/16/2022 at 1410 by MyChart Video and verified that I am speaking with the correct person using two identifiers. Austin Gonzales is currently located at home and patient is currently with them during visit. The provider, Austin Pouch, FNP is located in their office at time of visit.  I discussed the limitations, risks, security and privacy concerns of performing an evaluation and management service by virtual visit and the availability of in person appointments. I also discussed with the patient that there may be a patient responsible charge related to this service. The patient expressed understanding and agreed to proceed.  Subjective:  Patient ID: Austin Gonzales, male    DOB: Dec 01, 1965, 57 y.o.   MRN: [id number]  Chief Complaint:  Hemorrhoids   HPI: Austin Gonzales is a 57 y.o. male presenting on 02/16/2022 for Hemorrhoids   Pt reports he was seen by his GI on 01/12/2022 and placed on 2 weeks of Rifaximin. States he continues to have diarrhea and over the last several days his hemorrhoids have been bothering him significantly. He has called his GI office but has not received a return call. States at times it is hard to get the hemorrhoids "to go back in". He has been taking sitz baths with minimal relief of symptoms.      Relevant past medical, surgical, family, and social history reviewed and updated as indicated.  Allergies and medications reviewed and updated.   Past Medical  History:  Diagnosis Date   Anemia    Anxiety    GERD (gastroesophageal reflux disease)    Hyperlipidemia    Irritable bowel syndrome    Melanoma (Austin Gonzales)    on lip    Past Surgical History:  Procedure Laterality Date   BIOPSY  11/06/2020   Procedure: BIOPSY;  Surgeon: Montez Morita, Quillian Quince, MD;  [facility];  Service: Gastroenterology;;  small bowel, GE junction,random colon biopsies   COLONOSCOPY N/A 06/26/2013   Procedure: COLONOSCOPY;  Surgeon: Rogene Houston, MD;  [facility];  Service: Endoscopy;  Laterality: N/A;  1200   COLONOSCOPY WITH PROPOFOL N/A 11/06/2020   Procedure: COLONOSCOPY WITH PROPOFOL;  Surgeon: Austin Quale, MD;  [facility];  Service: Gastroenterology;  Laterality: N/A;  9:20   ESOPHAGOGASTRODUODENOSCOPY (EGD) WITH PROPOFOL N/A 11/06/2020   Procedure: ESOPHAGOGASTRODUODENOSCOPY (EGD) WITH PROPOFOL;  Surgeon: Austin Quale, MD;  [facility];  Service: Gastroenterology;  Laterality: N/A;   FRACTURE SURGERY Right    Jaw   HERNIA REPAIR     umbilical   POLYPECTOMY  11/06/2020   Procedure: POLYPECTOMY;  Surgeon: Austin Quale, MD;  [facility];  Service: Gastroenterology;;  cecal;ascending;    Social History   Socioeconomic History   Marital status: Divorced    Spouse name: Not on file   Number of children: 2   Years of education: Not on file   Highest education level: Not on file  Occupational History   Occupation: m   Occupation: Restaurant manager, fast food  Tobacco  Use   Smoking status: Every Day    Packs/day: 0.50    Years: 15.00    Total pack years: 7.50    Types: Cigarettes   Smokeless tobacco: Never   Tobacco comments:    1 pack a day x 15 yrs  Vaping Use   Vaping Use: Never used  Substance and Sexual Activity   Alcohol use: Yes    Comment: drinks 12 on weekends   Drug use: No   Sexual activity: Yes  Other Topics Concern   Not on file  Social  History Narrative   Not on file   Social Determinants of Health   Financial Resource Strain: Not on file  Food Insecurity: Not on file  Transportation Needs: Not on file  Physical Activity: Not on file  Stress: Not on file  Social Connections: Not on file  Intimate Partner Violence: Not on file    Outpatient Encounter Medications as of 02/16/2022  Medication Sig   ALPRAZolam (XANAX) 0.5 MG tablet Take 1 tablet (0.5 mg total) by mouth 2 (two) times daily as needed for anxiety.   bismuth subsalicylate (PEPTO BISMOL) 262 MG/15ML suspension Take 30 mLs by mouth every 6 (six) hours as needed.   dicyclomine (BENTYL) 10 MG capsule TAKE ONE CAPSULE THREE TIMES DAILY BEFORE MEALS   fluticasone (FLONASE) 50 MCG/ACT nasal spray Place 1 spray into both nostrils daily as needed for allergies or rhinitis.   loperamide (IMODIUM) 2 MG capsule Take 2 mg by mouth 2 (two) times daily.   omeprazole (PRILOSEC) 40 MG capsule Take 1 capsule by mouth once daily   PARoxetine (PAXIL) 40 MG tablet Take 40 mg by mouth daily.   No facility-administered encounter medications on file as of 02/16/2022.    Allergies  Allergen Reactions   Amoxicillin Diarrhea   Latex     blister   Trazodone     Nose bleeds.    Review of Systems  Constitutional:  Negative for activity change, appetite change, chills, diaphoresis, fatigue, fever and unexpected weight change.  HENT: Negative.    Eyes: Negative.   Respiratory:  Negative for cough, chest tightness and shortness of breath.   Cardiovascular:  Negative for chest pain, palpitations and leg swelling.  Gastrointestinal:  Positive for blood in stool, diarrhea, nausea and rectal pain. Negative for abdominal distention, abdominal pain, anal bleeding, constipation and vomiting.  Endocrine: Negative.   Genitourinary:  Negative for decreased urine volume, difficulty urinating, dysuria, frequency and urgency.  Musculoskeletal:  Negative for arthralgias and myalgias.  Skin:  Negative.   Allergic/Immunologic: Negative.   Neurological:  Negative for dizziness, tremors, seizures, syncope, facial asymmetry, speech difficulty, weakness, light-headedness, numbness and headaches.  Hematological: Negative.   Psychiatric/Behavioral:  Negative for confusion, hallucinations, sleep disturbance and suicidal ideas.   All other systems reviewed and are negative.        Observations/Objective: No vital signs or physical exam, this was a virtual health encounter.  Pt alert and oriented, answers all questions appropriately, and able to speak in full sentences.    Assessment and Plan: Titan was seen today for hemorrhoids.  Diagnoses and all orders for this visit:  Other hemorrhoids Pt is currently awaiting a return call from his GI. If GI does not call pt before the end of the business day today, pt is to call the office for further guidance.     Follow Up Instructions: Return if symptoms worsen or fail to improve.    I discussed the assessment and  treatment plan with the patient. The patient was provided an opportunity to ask questions and all were answered. The patient agreed with the plan and demonstrated an understanding of the instructions.   The patient was advised to call back or seek an in-person evaluation if the symptoms worsen or if the condition fails to improve as anticipated.  The above assessment and management plan was discussed with the patient. The patient verbalized understanding of and has agreed to the management plan. Patient is aware to call the clinic if they develop any new symptoms or if symptoms persist or worsen. Patient is aware when to return to the clinic for a follow-up visit. Patient educated on when it is appropriate to go to the emergency department.    I provided 15 minutes of time during this telephone encounter.   Austin Pouch, FNP-C [facility] [address] [place], [place] [address] [phone] 02/16/2022

## 2022-02-16 NOTE — Telephone Encounter (Signed)
Pt states he was placed on xifaxin for 14 days and towards the end of the medication he feels his diarrhea got worse. Reports his hemorrhoids were as big as golf balls and hanging out. He soaked in the tub in warm water and used prep h supp and creams and they have gone back inside now. He reports they still come out when he has a BM now. He also states he has had some nausea and is out of zofran. Pt requesting more zofran and perhaps some prescription hemorrhoid cream or supp. Please advise.

## 2022-02-16 NOTE — Telephone Encounter (Signed)
Patient just recently completed antibiotic 14 day regimen and it worked fine for him but now he has diarrhea along with hemorrhoids and wants to know of ways to get relief. Please advise.

## 2022-02-17 NOTE — Telephone Encounter (Signed)
Prescriptions sent to pharmacy. Pt already has OV scheduled. Work note sent to pt via Pharmacist, community.

## 2022-03-15 ENCOUNTER — Encounter: Payer: Self-pay | Admitting: Gastroenterology

## 2022-03-15 ENCOUNTER — Ambulatory Visit (INDEPENDENT_AMBULATORY_CARE_PROVIDER_SITE_OTHER): Payer: BC Managed Care – PPO | Admitting: Gastroenterology

## 2022-03-15 VITALS — BP 116/68 | HR 82 | Wt 170.0 lb

## 2022-03-15 DIAGNOSIS — K580 Irritable bowel syndrome with diarrhea: Secondary | ICD-10-CM | POA: Diagnosis not present

## 2022-03-15 DIAGNOSIS — K2100 Gastro-esophageal reflux disease with esophagitis, without bleeding: Secondary | ICD-10-CM | POA: Diagnosis not present

## 2022-03-15 DIAGNOSIS — Z789 Other specified health status: Secondary | ICD-10-CM | POA: Diagnosis not present

## 2022-03-15 MED ORDER — VIBERZI 100 MG PO TABS: 100.0000 mg | ORAL_TABLET | Freq: Two times a day (BID) | ORAL | 3 refills | Status: DC

## 2022-03-15 MED ORDER — VIBERZI 100 MG PO TABS: 100.0000 mg | ORAL_TABLET | Freq: Two times a day (BID) | ORAL | 3 refills | Status: AC

## 2022-03-15 NOTE — Patient Instructions (Addendum)
_______________________________________________________  If your blood pressure at your visit was 140/90 or greater, please contact your primary care physician to follow up on this.  _______________________________________________________  If you are age 59 or older, your body mass index should be between 23-30. Your Body mass index is 24.39 kg/m. If this is out of the aforementioned range listed, please consider follow up with your Primary Care Provider.  If you are age 56 or younger, your body mass index should be between 19-25. Your Body mass index is 24.39 kg/m. If this is out of the aformentioned range listed, please consider follow up with your Primary Care Provider.   Please purchase Metamucil over the counter. Take as directed.   We have sent the following medications to your pharmacy for you to pick up at your convenience:Viberzi 100 mg twice daily.  Please call Radiology to schedule your Ultrasound. call [phone]  The [facility] providers would like to encourage you to use [facility] to communicate with providers for non-urgent requests or questions.  Due to long hold times on the telephone, sending your provider a message by [facility] may be a faster and more efficient way to get a response.  Please allow 48 business hours for a response.  Please remember that this is for non-urgent requests.   It was a pleasure to see you today!  Thank you for trusting me with your gastrointestinal care!    Scott E.Candis Schatz, MD

## 2022-03-15 NOTE — Progress Notes (Unsigned)
HPI : Austin Gonzales is a very pleasant 57 year old male with IBS, GERD and a history of excessive alcohol use who presents for follow up.  He was initially seen by me in January 12, 2022 at which time he was prescribed rifaximin for his diarrhea-predominant IBS.  An [place] was ordered to assess for evidence of chronic liver damage from his history of heavy alcohol use. Today, he reports that his diarrhea did not really improve with the trial of Rifaximin.  He continues to have good days and bad days.  He had a flare of his hemorrhoids in the setting of worsening diarrhea, and reports symptoms consistent with prolapsed hemorrhoids which lasted several weeks (hemorrhoids would reduce, but then would prolapse again with each bowel movement.  He says that he has had hemorrhoid symptoms in the past, but never as bad as that. He is sleeping better now and has not had problems with nocturnal bowel movements recently. He continues to take 1-2 Imodium in the morning.  He takes Bentyl, but doesn't really think he does much for him.  He has reduced his drinking by half.  Just drinking on Friday and Saturday nights, typically 6 beers per night, whereas it used to be 12.   EGD: Oct 2023 Dr. Jenetta Downer  2 tongues of salmon colored mucosa with chronic inflammation, no IM Duodenal biopsies negative for celiac  Colonoscopy Oct 2023 Dr. Jenetta Downer 3 small Tas, diverticulosis, normal T.I. Random biopsies negative for [facility]. -Repeat in 5 years    Past Medical History:  Diagnosis Date   Anemia    Anxiety    GERD (gastroesophageal reflux disease)    Hyperlipidemia    Irritable bowel syndrome    Melanoma ([facility])    on lip     Past Surgical History:  Procedure Laterality Date   BIOPSY  11/06/2020   Procedure: BIOPSY;  Surgeon: Harvel Quale, MD;  [facility];  Service: Gastroenterology;;  small bowel, GE junction,random colon biopsies   COLONOSCOPY N/A 06/26/2013   Procedure:  COLONOSCOPY;  Surgeon: Rogene Houston, MD;  [facility];  Service: Endoscopy;  Laterality: N/A;  1200   COLONOSCOPY WITH PROPOFOL N/A 11/06/2020   Procedure: COLONOSCOPY WITH PROPOFOL;  Surgeon: Harvel Quale, MD;  [facility];  Service: Gastroenterology;  Laterality: N/A;  9:20   ESOPHAGOGASTRODUODENOSCOPY (EGD) WITH PROPOFOL N/A 11/06/2020   Procedure: ESOPHAGOGASTRODUODENOSCOPY (EGD) WITH PROPOFOL;  Surgeon: Harvel Quale, MD;  [facility];  Service: Gastroenterology;  Laterality: N/A;   FRACTURE SURGERY Right    Jaw   HERNIA REPAIR     umbilical   POLYPECTOMY  11/06/2020   Procedure: POLYPECTOMY;  Surgeon: Harvel Quale, MD;  [facility]: [facility];  Service: Gastroenterology;;  cecal;ascending;   Family History  Problem Relation Age of Onset   Cancer Mother        breast   Heart disease Mother    Hyperlipidemia Mother    Atrial fibrillation Mother    COPD Father    Asthma Father    Colon polyps Father    Irritable bowel syndrome Sister    Other Sister        vertigo   Colon cancer Neg Hx    Esophageal cancer Neg Hx    Social History   Tobacco Use   Smoking status: Every Day    Packs/day: 0.50    Years: 15.00    Total pack years: 7.50  Types: Cigarettes   Smokeless tobacco: Never   Tobacco comments:    1 pack a day x 15 yrs  Vaping Use   Vaping Use: Never used  Substance Use Topics   Alcohol use: Yes    Comment: drinks 12 on weekends   Drug use: No   Current Outpatient Medications  Medication Sig Dispense Refill   ALPRAZolam (XANAX) 0.5 MG tablet Take 1 tablet (0.5 mg total) by mouth 2 (two) times daily as needed for anxiety. 60 tablet 0   bismuth subsalicylate (PEPTO BISMOL) 262 MG/15ML suspension Take 30 mLs by mouth every 6 (six) hours as needed.     dicyclomine (BENTYL) 10 MG capsule TAKE ONE CAPSULE THREE TIMES DAILY BEFORE MEALS 90 capsule 2   fluticasone (FLONASE) 50 MCG/ACT nasal  spray Place 1 spray into both nostrils daily as needed for allergies or rhinitis.     hydrocortisone (ANUSOL-HC) 25 MG suppository Place 1 suppository (25 mg total) rectally 2 (two) times daily. 28 suppository 0   loperamide (IMODIUM) 2 MG capsule Take 2 mg by mouth 2 (two) times daily.     omeprazole (PRILOSEC) 40 MG capsule Take 1 capsule by mouth once daily 90 capsule 0   ondansetron (ZOFRAN) 4 MG tablet Take 1 tablet (4 mg total) by mouth every 8 (eight) hours as needed for nausea or vomiting. 30 tablet 3   PARoxetine (PAXIL) 40 MG tablet Take 40 mg by mouth daily.     No current facility-administered medications for this visit.   Allergies  Allergen Reactions   Amoxicillin Diarrhea   Latex     blister   Trazodone     Nose bleeds.     Review of Systems: All systems reviewed and negative except where noted in HPI.    No results found.  Physical Exam: BP 116/68   Pulse 82   Wt 170 lb (77.1 kg)   SpO2 97%   BMI 24.39 kg/m  Constitutional: Pleasant,well-developed, Caucasian male in no acute distress. HEENT: Normocephalic and atraumatic. Conjunctivae are normal. No scleral icterus. Neck supple.  Cardiovascular: Normal rate, regular rhythm.  Pulmonary/chest: Effort normal and breath sounds normal. No wheezing, rales or rhonchi. Abdominal: Soft, nondistended, nontender. Bowel sounds active throughout. There are no masses palpable. No hepatomegaly. Extremities: no edema Neurological: Alert and oriented to person place and time. Skin: Skin is warm and dry. No rashes noted. Psychiatric: Normal mood and affect. Behavior is normal.  CBC    Component Value Date/Time   WBC 6.7 01/12/2022 1431   RBC 4.09 (L) 01/12/2022 1431   HGB 14.3 01/12/2022 1431   HGB 12.3 (L) 08/04/2020 1603   HCT 42.3 01/12/2022 1431   HCT 34.7 (L) 08/04/2020 1603   PLT 255.0 01/12/2022 1431   PLT 194 08/04/2020 1603   MCV 103.4 (H) 01/12/2022 1431   MCV 104 (H) 08/04/2020 1603   MCH 36.9 (H)  08/04/2020 1603   MCH 31.1 04/04/2014 1701   MCHC 33.8 01/12/2022 1431   RDW 12.8 01/12/2022 1431   RDW 11.6 08/04/2020 1603   LYMPHSABS 1.8 01/12/2022 1431   LYMPHSABS 1.7 08/04/2020 1603   MONOABS 1.0 01/12/2022 1431   EOSABS 0.0 01/12/2022 1431   EOSABS 0.2 08/04/2020 1603   BASOSABS 0.1 01/12/2022 1431   BASOSABS 0.1 08/04/2020 1603    CMP     Component Value Date/Time   NA 138 01/12/2022 1431   NA 141 08/04/2020 1603   K 3.5 01/12/2022 1431   CL 101 01/12/2022  1431   CO2 25 01/12/2022 1431   GLUCOSE 97 01/12/2022 1431   BUN 6 01/12/2022 1431   BUN 5 (L) 08/04/2020 1603   CREATININE 1.03 01/12/2022 1431   CREATININE 0.98 07/06/2012 1647   CALCIUM 9.3 01/12/2022 1431   PROT 7.8 01/12/2022 1431   PROT 7.0 08/04/2020 1603   ALBUMIN 4.6 01/12/2022 1431   ALBUMIN 4.6 08/04/2020 1603   AST 18 01/12/2022 1431   ALT 12 01/12/2022 1431   ALKPHOS 49 01/12/2022 1431   BILITOT 0.6 01/12/2022 1431   BILITOT 0.3 08/04/2020 1603   GFRNONAA 89 02/17/2017 1454   GFRAA 103 02/17/2017 1454   Component Ref Range & Units 2 mo ago  GGT 7 - 51 U/L 87 High     ASSESSMENT AND PLAN: 57 year old male with IBS-D and alcohol use disorder with no improvement in his diarrhea following trial of Rifaximin.  He continues to frequent loose stools with urgency which impair his quality of life and impact his ability to work.   Although previously his alcohol use patterns were excessive for safe Viberzi use, he has since decrease his consumption to an average of <2/day.  I think Viberzi would be helpful for him. He can continue Imodium daily, and I also recommended adding metamucil daily to improve his stool bulk.  As bentyl does not seem to helping him, I suggested he stop taking it, to reduce the number of medications he takes daily. He has not gotten the RUQUS as was ordered previously, as he was told he needed to pay $500 up front for the study. His GERD continues to be well managed with once  daily omeprazole.  IBS-D - Start Viberzi 100 mg PO BID - Add metamucil daily - Ok to stop Bentyl given lack of benefit - Continue loperamide  Heavy alcohol use - RUQUS as ordered previously  GERD - Continue once daily omeprazole  Anelis Hrivnak E. Candis Schatz, MD [facility]   Hassell Done, Mary-Margaret, *

## 2022-03-22 ENCOUNTER — Ambulatory Visit: Payer: BC Managed Care – PPO | Admitting: Gastroenterology

## 2022-05-03 ENCOUNTER — Encounter: Payer: Self-pay | Admitting: Family Medicine

## 2022-05-03 ENCOUNTER — Ambulatory Visit: Payer: BC Managed Care – PPO | Admitting: Family Medicine

## 2022-05-03 VITALS — BP 137/89 | HR 111 | Temp 98.7°F | Ht 70.0 in | Wt 168.0 lb

## 2022-05-03 DIAGNOSIS — A084 Viral intestinal infection, unspecified: Secondary | ICD-10-CM | POA: Diagnosis not present

## 2022-05-03 MED ORDER — PROMETHAZINE HCL 25 MG PO TABS: 25.0000 mg | ORAL_TABLET | Freq: Three times a day (TID) | ORAL | 0 refills | Status: DC | PRN

## 2022-05-03 MED ORDER — HYDROCORTISONE ACETATE 25 MG RE SUPP: 25.0000 mg | Freq: Two times a day (BID) | RECTAL | 0 refills | Status: AC

## 2022-05-03 MED ORDER — HYDROCORTISONE ACETATE 25 MG RE SUPP: 25.0000 mg | Freq: Two times a day (BID) | RECTAL | 0 refills | Status: DC

## 2022-05-03 NOTE — Progress Notes (Signed)
Subjective: CC: GI upset PCP: Bennie Pierini, FNP [id number] Austin Gonzales is a 57 y.o. male presenting to clinic today for:  1.  Nausea, vomiting and diarrhea Patient reports chronic history of irritable bowel syndrome with diarrhea.  He notes that up until about a week ago he was doing really well with the Viberzi that was recently prescribed by his gastroenterologist.  He notes no resolution of diarrhea but certainly decreased frequency.  He last week started having increased nausea, vomiting and diarrhea.  This caused his hemorrhoids a flareup but he thinks he has that under control now.  He still not feeling well and has not regained his appetite because of the queasiness.  Has been utilizing Zofran but this does not really resolve his issue.  He does not report any known sick contacts or consumption of undercooked foods.  He has missed several days of work and will need a work note.  Denies any consumption of alcohol except for occasional on the weekends   ROS: Per HPI  Allergies  Allergen Reactions   Amoxicillin Diarrhea   Latex     blister   Trazodone     Nose bleeds.   Past Medical History:  Diagnosis Date   Anemia    Anxiety    GERD (gastroesophageal reflux disease)    Hyperlipidemia    Irritable bowel syndrome    Melanoma    on lip    Current Outpatient Medications:    ALPRAZolam (XANAX) 0.5 MG tablet, Take 1 tablet (0.5 mg total) by mouth 2 (two) times daily as needed for anxiety., Disp: 60 tablet, Rfl: 0   bismuth subsalicylate (PEPTO BISMOL) 262 MG/15ML suspension, Take 30 mLs by mouth every 6 (six) hours as needed., Disp: , Rfl:    dicyclomine (BENTYL) 10 MG capsule, TAKE ONE CAPSULE THREE TIMES DAILY BEFORE MEALS, Disp: 90 capsule, Rfl: 2   Eluxadoline (VIBERZI) 100 MG TABS, Take 1 tablet (100 mg total) by mouth 2 (two) times daily., Disp: 180 tablet, Rfl: 3   fluticasone (FLONASE) 50 MCG/ACT nasal spray, Place 1 spray into both nostrils daily as needed for  allergies or rhinitis., Disp: , Rfl:    hydrocortisone (ANUSOL-HC) 25 MG suppository, Place 1 suppository (25 mg total) rectally 2 (two) times daily., Disp: 28 suppository, Rfl: 0   loperamide (IMODIUM) 2 MG capsule, Take 2 mg by mouth 2 (two) times daily., Disp: , Rfl:    omeprazole (PRILOSEC) 40 MG capsule, Take 1 capsule by mouth once daily, Disp: 90 capsule, Rfl: 0   ondansetron (ZOFRAN) 4 MG tablet, Take 1 tablet (4 mg total) by mouth every 8 (eight) hours as needed for nausea or vomiting., Disp: 30 tablet, Rfl: 3   PARoxetine (PAXIL) 40 MG tablet, Take 40 mg by mouth daily., Disp: , Rfl:  Social History   Socioeconomic History   Marital status: Divorced    Spouse name: Not on file   Number of children: 2   Years of education: Not on file   Highest education level: Not on file  Occupational History   Occupation: m   Occupation: Research scientist (physical sciences)  Tobacco Use   Smoking status: Every Day    Packs/day: 0.50    Years: 15.00    Additional pack years: 0.00    Total pack years: 7.50    Types: Cigarettes   Smokeless tobacco: Never   Tobacco comments:    1 pack a day x 15 yrs  Vaping Use   Vaping Use: Never used  Substance and Sexual Activity   Alcohol use: Yes    Comment: drinks 12 on weekends   Drug use: No   Sexual activity: Yes  Other Topics Concern   Not on file  Social History Narrative   Not on file   Social Determinants of Health   Financial Resource Strain: Not on file  Food Insecurity: Not on file  Transportation Needs: Not on file  Physical Activity: Not on file  Stress: Not on file  Social Connections: Not on file  Intimate Partner Violence: Not on file   Family History  Problem Relation Age of Onset   Cancer Mother        breast   Heart disease Mother    Hyperlipidemia Mother    Atrial fibrillation Mother    COPD Father    Asthma Father    Colon polyps Father    Irritable bowel syndrome Sister    Other Sister        vertigo   Colon cancer Neg  Hx    Esophageal cancer Neg Hx     Objective: Office vital signs reviewed. BP 137/89   Pulse (!) 111   Temp 98.7 F (37.1 Austin)   Ht 5\' 10"  (1.778 m)   Wt 168 lb (76.2 kg)   SpO2 99%   BMI 24.11 kg/m   Physical Examination:  General: Awake, alert, nontoxic male, No acute distress HEENT: Sclera white.  Moist mucous membranes Cardio: regular rate and rhythm, S1S2 heard, no murmurs appreciated Pulm: clear to auscultation bilaterally, no wheezes, rhonchi or rales; normal work of breathing on room air GI: soft, non-tender, non-distended, bowel sounds present x4, no hepatomegaly, no splenomegaly, no masses  Assessment/ Plan: 57 y.o. male   Viral gastroenteritis - Plan: promethazine (PHENERGAN) 25 MG tablet  Suspect that he has a viral gastroenteritis that is complicated by IBS-D.  I have given him promethazine to have on hand and lieu of the Zofran to see if perhaps this might give him better antinausea effect.  Continue medications as prescribed by his gastroenterologist to follow-up with gastroenterology if symptoms do not resolve.  Work note provided excusing from days missed.  Continue to hydrate adequately.  Follow-up as needed  No orders of the defined types were placed in this encounter.  No orders of the defined types were placed in this encounter.    Raliegh Ip, DO [facility] [phone]

## 2022-05-03 NOTE — Patient Instructions (Signed)
Drink plenty of fluids Use Promethazine ONLY if needed.  It can cause sleepiness

## 2022-05-17 DIAGNOSIS — F411 Generalized anxiety disorder: Secondary | ICD-10-CM | POA: Diagnosis not present

## 2022-05-17 DIAGNOSIS — F331 Major depressive disorder, recurrent, moderate: Secondary | ICD-10-CM | POA: Diagnosis not present

## 2022-05-24 ENCOUNTER — Telehealth: Payer: Self-pay | Admitting: Gastroenterology

## 2022-05-24 NOTE — Telephone Encounter (Signed)
Patient called states he thinks he has a flare up has been vomiting and feeling really sick for about two days now. Seeking advise.

## 2022-05-24 NOTE — Telephone Encounter (Signed)
Pt states he has vomiting and diarrhea yesterday and today and was out of work. He thinks it was some sort of virus, he is starting to feel better. Pt knows to call us back if he does not continue to improve.

## 2022-05-26 ENCOUNTER — Encounter: Payer: Self-pay | Admitting: Family Medicine

## 2022-05-26 ENCOUNTER — Ambulatory Visit: Payer: BC Managed Care – PPO | Admitting: Family Medicine

## 2022-05-26 VITALS — BP 124/75 | HR 86 | Temp 98.2°F | Ht 70.0 in | Wt 168.1 lb

## 2022-05-26 DIAGNOSIS — R197 Diarrhea, unspecified: Secondary | ICD-10-CM | POA: Diagnosis not present

## 2022-05-26 DIAGNOSIS — K219 Gastro-esophageal reflux disease without esophagitis: Secondary | ICD-10-CM

## 2022-05-26 DIAGNOSIS — R112 Nausea with vomiting, unspecified: Secondary | ICD-10-CM

## 2022-05-26 DIAGNOSIS — K582 Mixed irritable bowel syndrome: Secondary | ICD-10-CM | POA: Diagnosis not present

## 2022-05-26 DIAGNOSIS — F4322 Adjustment disorder with anxiety: Secondary | ICD-10-CM

## 2022-05-26 MED ORDER — OMEPRAZOLE 40 MG PO CPDR
40.0000 mg | DELAYED_RELEASE_CAPSULE | Freq: Every day | ORAL | 0 refills | Status: DC
Start: 2022-05-26 — End: 2022-11-03

## 2022-05-26 NOTE — Progress Notes (Signed)
Acute Office Visit  Subjective:     Patient ID: Austin Gonzales, male    DOB: [birth year], 57 y.o.   MRN: [id number]  Chief Complaint  Patient presents with   Diarrhea    HPI Reprots nausea and NBNB vomiting, diarrhea that started on 05/20/22. Symptoms improved over 2 the next two days, and then worsened again 2 days ago. He reprots 4 episodes of diarrhea per day, not watery, no blood. Emesis x2 in last 24 hours. Recently switched from paxil to effexor on 05/2322 by [facility]. He then stopped taking effexor and this did help with his symptoms. He did wean off of paxil but abruptly stopped effexor. He did notify [facility] of this and is waiting to hear back from this regarding medication management.Marland Kitchen Hx of GERD and he has been out of prilosec. Also hx of IBS-D. He has stopped taking viberzi 2 days ago. He did not notify GI of this and does not have a follow up appt with GI.       05/26/2022   10:59 AM 05/03/2022    1:38 PM 10/26/2021    3:27 PM  Depression screen PHQ 2/9  Decreased Interest 2 1 1   Down, Depressed, Hopeless 2 1 2   PHQ - 2 Score 4 2 3   Altered sleeping 2 0 1  Tired, decreased energy 2 1 1   Change in appetite 2 1 1   Feeling bad or failure about yourself  2 1 1   Trouble concentrating 2 0 1  Moving slowly or fidgety/restless 1 0 0  Suicidal thoughts 0 0 0  PHQ-9 Score 15 5 8   Difficult doing work/chores Very difficult Somewhat difficult Somewhat difficult      05/26/2022   11:00 AM 05/03/2022    1:38 PM 10/26/2021    3:28 PM 02/05/2021    2:40 PM  GAD 7 : Generalized Anxiety Score  Nervous, Anxious, on Edge 2 0 1 1  Control/stop worrying 2 1 1 1   Worry too much - different things 2 1 1 1   Trouble relaxing 2 1 1 1   Restless 1 0 1 0  Easily annoyed or irritable 1 0 0 1  Afraid - awful might happen 1 0 0 0  Total GAD 7 Score 11 3 5 5   Anxiety Difficulty Very difficult Somewhat difficult Somewhat difficult Somewhat difficult     ROS Negative unless specially indicated above in  HPI.     Objective:    BP 124/75   Pulse 86   Temp 98.2 F (36.8 C) (Temporal)   Ht 5\' 10"  (1.778 m)   Wt 168 lb 2 oz (76.3 kg)   SpO2 95%   BMI 24.12 kg/m    Physical Exam Vitals and nursing note reviewed.  Constitutional:      General: He is not in acute distress.    Appearance: Normal appearance. He is not ill-appearing, toxic-appearing or diaphoretic.  Cardiovascular:     Rate and Rhythm: Normal rate and regular rhythm.     Heart sounds: Normal heart sounds. No murmur heard. Pulmonary:     Effort: Pulmonary effort is normal. No respiratory distress.     Breath sounds: Normal breath sounds.  Abdominal:     General: Bowel sounds are normal. There is no distension.     Palpations: Abdomen is soft.     Tenderness: There is no abdominal tenderness. There is no guarding or rebound.  Musculoskeletal:     Right lower leg: No edema.  Left lower leg: No edema.  Skin:    General: Skin is warm and dry.  Neurological:     General: No focal deficit present.     Mental Status: He is alert and oriented to person, place, and time.  Psychiatric:        Mood and Affect: Mood is anxious.        Behavior: Behavior normal.     No results found for any visits on 05/26/22.      Assessment & Plan:   Austin Gonzales was seen today for diarrhea.  Diagnoses and all orders for this visit:  Nausea and vomiting, unspecified vomiting type Gastroesophageal reflux disease, unspecified whether esophagitis present Uncontrolled. Restart prilosec. Continue zofran prn. Discussed hydration, bland diet. Follow up with GI.  -     omeprazole (PRILOSEC) 40 MG capsule; Take 1 capsule (40 mg total) by mouth daily.  Diarrhea, unspecified type IBS Stopped IBS treatment. Discussed to notify GI to discuss medication options.   Adjustment disorder with anxiety Uncontrolled, denies SI. He stopped medication. Discussed need for follow up with [facility].   Return if symptoms worsen or fail to improve.  The  patient indicates understanding of these issues and agrees with the plan.  Gabriel Earing, FNP

## 2022-08-29 ENCOUNTER — Emergency Department (HOSPITAL_COMMUNITY): Payer: Self-pay

## 2022-08-29 ENCOUNTER — Encounter (HOSPITAL_COMMUNITY): Payer: Self-pay

## 2022-08-29 ENCOUNTER — Emergency Department (HOSPITAL_COMMUNITY)
Admission: EM | Admit: 2022-08-29 | Discharge: 2022-08-29 | Disposition: A | Discharge status: Home / Self Care | Payer: Self-pay | Attending: Emergency Medicine | Admitting: Emergency Medicine

## 2022-08-29 DIAGNOSIS — R55 Syncope and collapse: Secondary | ICD-10-CM

## 2022-08-29 DIAGNOSIS — R569 Unspecified convulsions: Secondary | ICD-10-CM | POA: Insufficient documentation

## 2022-08-29 DIAGNOSIS — W19XXXA Unspecified fall, initial encounter: Secondary | ICD-10-CM | POA: Insufficient documentation

## 2022-08-29 DIAGNOSIS — R42 Dizziness and giddiness: Secondary | ICD-10-CM | POA: Insufficient documentation

## 2022-08-29 DIAGNOSIS — R930 Abnormal findings on diagnostic imaging of skull and head, not elsewhere classified: Secondary | ICD-10-CM | POA: Insufficient documentation

## 2022-08-29 DIAGNOSIS — R200 Anesthesia of skin: Secondary | ICD-10-CM | POA: Insufficient documentation

## 2022-08-29 DIAGNOSIS — F17210 Nicotine dependence, cigarettes, uncomplicated: Secondary | ICD-10-CM | POA: Insufficient documentation

## 2022-08-29 LAB — CBC
HCT: 36.7 % — ABNORMAL LOW (ref 39.0–52.0)
Hemoglobin: 12.8 g/dL — ABNORMAL LOW (ref 13.0–17.0)
MCH: 34.5 pg — ABNORMAL HIGH (ref 26.0–34.0)
MCHC: 34.9 g/dL (ref 30.0–36.0)
MCV: 98.9 fL (ref 80.0–100.0)
Platelets: 204 10*3/uL (ref 150–400)
RBC: 3.71 MIL/uL — ABNORMAL LOW (ref 4.22–5.81)
RDW: 12.5 % (ref 11.5–15.5)
WBC: 5 10*3/uL (ref 4.0–10.5)
nRBC: 0 % (ref 0.0–0.2)

## 2022-08-29 LAB — COMPREHENSIVE METABOLIC PANEL
ALT: 15 U/L (ref 0–44)
AST: 26 U/L (ref 15–41)
Albumin: 3.7 g/dL (ref 3.5–5.0)
Alkaline Phosphatase: 46 U/L (ref 38–126)
Anion gap: 15 (ref 5–15)
BUN: <5 mg/dL — ABNORMAL LOW (ref 6–20)
CO2: 20 mmol/L — ABNORMAL LOW (ref 22–32)
Calcium: 8.8 mg/dL — ABNORMAL LOW (ref 8.9–10.3)
Chloride: 99 mmol/L (ref 98–111)
Creatinine, Ser: 1 mg/dL (ref 0.61–1.24)
GFR, Estimated: >60 mL/min (ref >60)
Glucose, Bld: 85 mg/dL (ref 70–99)
Potassium: 3.5 mmol/L (ref 3.5–5.1)
Sodium: 134 mmol/L — ABNORMAL LOW (ref 135–145)
Total Bilirubin: 0.3 mg/dL (ref 0.3–1.2)
Total Protein: 6.9 g/dL (ref 6.5–8.1)

## 2022-08-29 LAB — CBG MONITORING, ED: Glucose-Capillary: 89 mg/dL (ref 70–99)

## 2022-08-29 MED ORDER — ONDANSETRON HCL 4 MG/2ML IJ SOLN
4.0000 mg | Freq: Once | INTRAMUSCULAR | Status: AC
  Administered 2022-08-29: 4 mg via INTRAVENOUS
  Filled 2022-08-29: qty 2

## 2022-08-29 MED ORDER — SODIUM CHLORIDE 0.9 % IV BOLUS
1000.0000 mL | Freq: Once | INTRAVENOUS | Status: AC
  Administered 2022-08-29: 1000 mL via INTRAVENOUS

## 2022-08-29 MED ORDER — ONDANSETRON 4 MG PO TBDP: 4.0000 mg | ORAL_TABLET | Freq: Three times a day (TID) | ORAL | 0 refills | Status: DC | PRN

## 2022-08-29 NOTE — ED Provider Notes (Signed)
[facility] Emergency Department Provider Note MRN:  [id number]  Arrival date & time: 08/29/22     Chief Complaint   Seizures (Possible seizure around 3:30 am, ex wife called EMS, patient arrives with left sided facial numbness)   History of Present Illness   Austin Gonzales is a 57 y.o. year-old male with no pertinent past presenting to the ED with chief complaint of seizure.  Wife called EMS for possible seizure activity.  Patient does not remember what happened.  Last thing remembers is he was in bed asleep and then EMS was there.  Denies fall.  Denies pain.  Endorses a subtle abnormal sensation to the left side of the face, was not there when he went to bed.  Review of Systems  A thorough review of systems was obtained and all systems are negative except as noted in the HPI and PMH.   Patient's Health History    Past Medical History:  Diagnosis Date   Anemia    Anxiety    GERD (gastroesophageal reflux disease)    Hyperlipidemia    Irritable bowel syndrome    Melanoma (HCC)    on lip    Past Surgical History:  Procedure Laterality Date   BIOPSY  11/06/2020   Procedure: BIOPSY;  Surgeon: Marguerita Merles, Reuel Boom, MD;  [facility]: [facility];  Service: Gastroenterology;;  small bowel, GE junction,random colon biopsies   COLONOSCOPY N/A 06/26/2013   Procedure: COLONOSCOPY;  Surgeon: Malissa Hippo, MD;  Location: [facility];  Service: Endoscopy;  Laterality: N/A;  1200   COLONOSCOPY WITH PROPOFOL N/A 11/06/2020   Procedure: COLONOSCOPY WITH PROPOFOL;  Surgeon: Dolores Frame, MD;  Location: [facility];  Service: Gastroenterology;  Laterality: N/A;  9:20   ESOPHAGOGASTRODUODENOSCOPY (EGD) WITH PROPOFOL N/A 11/06/2020   Procedure: ESOPHAGOGASTRODUODENOSCOPY (EGD) WITH PROPOFOL;  Surgeon: Dolores Frame, MD;  Location[facility];  Service: Gastroenterology;  Laterality: N/A;   FRACTURE SURGERY Right    Jaw   HERNIA  REPAIR     umbilical   POLYPECTOMY  11/06/2020   Procedure: POLYPECTOMY;  Surgeon: Marguerita Merles, Reuel Boom, MD;  Location: [facility];  Service: Gastroenterology;;  cecal;ascending;    Family History  Problem Relation Age of Onset   Cancer Mother        breast   Heart disease Mother    Hyperlipidemia Mother    Atrial fibrillation Mother    COPD Father    Asthma Father    Colon polyps Father    Irritable bowel syndrome Sister    Other Sister        vertigo   Colon cancer Neg Hx    Esophageal cancer Neg Hx     Social History   Socioeconomic History   Marital status: Divorced    Spouse name: Not on file   Number of children: 2   Years of education: Not on file   Highest education level: Not on file  Occupational History   Occupation: m   Occupation: Research scientist (physical sciences)  Tobacco Use   Smoking status: Every Day    Current packs/day: 0.50    Average packs/day: 0.5 packs/day for 15.0 years (7.5 ttl pk-yrs)    Types: Cigarettes   Smokeless tobacco: Never   Tobacco comments:    1 pack a day x 15 yrs  Vaping Use   Vaping status: Never Used  Substance and Sexual Activity   Alcohol use: Yes    Comment: drinks 12  on weekends   Drug use: No   Sexual activity: Yes  Other Topics Concern   Not on file  Social History Narrative   Not on file   Social Determinants of Health   Financial Resource Strain: Not on file  Food Insecurity: Not on file  Transportation Needs: Not on file  Physical Activity: Not on file  Stress: Not on file  Social Connections: Not on file  Intimate Partner Violence: Not on file     Physical Exam   Vitals:   08/29/22 0608 08/29/22 0609  BP:    Pulse:    Resp:    Temp: 98.9 F (37.2 C)   SpO2:  98%    CONSTITUTIONAL: Well-appearing, NAD NEURO/PSYCH:  Alert and oriented x 3, normal and symmetric strength and sensation, subjective decrease sensation to left face, normal speech EYES:  eyes equal and reactive ENT/NECK:  no LAD, no  JVD CARDIO: Regular rate, well-perfused, normal S1 and S2 PULM:  CTAB no wheezing or rhonchi GI/GU:  non-distended, non-tender MSK/SPINE:  No gross deformities, no edema SKIN:  no rash, atraumatic   *Additional and/or pertinent findings included in MDM below  Diagnostic and Interventional Summary    EKG Interpretation Date/Time:  Monday August 29 2022 06:03:12 EDT Ventricular Rate:  95 PR Interval:  140 QRS Duration:  97 QT Interval:  364 QTC Calculation: 458 R Axis:   -12  Text Interpretation: Sinus rhythm Anteroseptal infarct, old Confirmed by Kennis Carina [phone]) on 08/29/2022 6:59:50 AM       Labs Reviewed  CBC - Abnormal; Notable for the following components:      Result Value   RBC 3.71 (*)    Hemoglobin 12.8 (*)    HCT 36.7 (*)    MCH 34.5 (*)    All other components within normal limits  COMPREHENSIVE METABOLIC PANEL  CBG MONITORING, ED    CT HEAD WO CONTRAST ( )  Final Result    MR BRAIN WO CONTRAST    (Results Pending)  DG Lumbar Spine Complete    (Results Pending)    Medications  sodium chloride 0.9 % bolus 1,000 mL (has no administration in time range)  ondansetron (ZOFRAN) injection 4 mg (has no administration in time range)     Procedures  /  Critical Care Procedures  ED Course and Medical Decision Making  Initial Impression and Ddx Question seizure at home.  Patient reports that he probably had a seizure at home a few years ago and bit his tongue.  Wife is not here to provide further history.  Patient is expressing decreased sensation or sensory disturbances left side of her face.  Onset of the symptoms unknown, last known normal before he went to bed last night.  Otherwise his exam is without objective findings.  Pending CT head, basic labs, monitoring closely.  Past medical/surgical history that increases complexity of ED encounter: None  Interpretation of Diagnostics I personally reviewed the EKG and my interpretation is as follows: Sinus  rhythm  Labs overall reassuring with no significant blood count or electrolyte disturbance.  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  Patient woke up this morning feeling dizzy, nauseated.  Tried to take a shower and then he fell.  Passed out.  Still feeling dizzy and nauseated.  And so plan is to obtain MRI to exclude posterior circulation stroke.  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  Signed  out to oncoming provider at shift change.  Patient management required discussion with the following services or consulting groups:  None  Complexity of Problems Addressed Acute illness or injury that poses threat of life of bodily function  Additional Data Reviewed and Analyzed Further history obtained from: None  Additional Factors Impacting ED Encounter Risk None  Elmer Sow. Pilar Plate, MD [facility] Emergency Medicine [facility] [email or web address]  Final Clinical Impressions(s) / ED Diagnoses     ICD-10-CM   1. Fall, initial encounter  W19.XXXA     2. Dizzy  R42     3. Seizure-like activity (HCC)  R56.9       ED Discharge Orders     None        Discharge Instructions Discussed with and Provided to Patient:   Discharge Instructions   None      Sabas Sous, MD 08/29/22 [phone]

## 2022-08-29 NOTE — ED Provider Notes (Signed)
  Physical Exam  BP 113/85 (BP Location: Right Arm)   Pulse 77   Temp 98.6 F (37 C) (Oral)   Resp 18   SpO2 100%   Physical Exam Constitutional:      General: He is not in acute distress.    Appearance: Normal appearance.  HENT:     Head: Normocephalic and atraumatic.     Nose: No congestion or rhinorrhea.  Eyes:     General:        Right eye: No discharge.        Left eye: No discharge.     Extraocular Movements: Extraocular movements intact.     Pupils: Pupils are equal, round, and reactive to light.  Cardiovascular:     Rate and Rhythm: Normal rate and regular rhythm.     Heart sounds: No murmur heard. Pulmonary:     Effort: No respiratory distress.     Breath sounds: No wheezing or rales.  Abdominal:     General: There is no distension.     Tenderness: There is no abdominal tenderness.  Musculoskeletal:        General: Normal range of motion.     Cervical back: Normal range of motion.  Skin:    General: Skin is warm and dry.  Neurological:     General: No focal deficit present.     Mental Status: He is alert.     Procedures  Procedures  ED Course / MDM    Medical Decision Making Amount and/or Complexity of Data Reviewed Labs: ordered. Radiology: ordered.  Risk Prescription drug management.   Patient received in handoff.  Possible seizure activity versus syncope pending MRI.  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  On further history taking, it appears the patient has been working outside yesterday for an extended period of time in the heat and the episode was more lightheadedness and patient does not appear to be experiencing any vertigo at this time.  He was fluid resuscitated with lactated Ringer's and given nausea control with Zofran and on reevaluation his symptoms have resolved.  Suspect orthostatic syncope yesterday and at this time with symptoms resolved he does not meet inpatient criteria for hospital admission.   He was encouraged to aggressively hydrate at home and was given return precautions of which she voiced understanding.  He was then discharged with outpatient follow-up.       Glendora Score, MD 08/29/22 [phone]

## 2022-08-30 ENCOUNTER — Telehealth: Payer: Self-pay | Admitting: *Deleted

## 2022-08-30 NOTE — Telephone Encounter (Signed)
Pt called regarding pharmacy not receiving Rx as stated on After Visit Summary (AVS).  RNCM called in Rx as written to [facility] in [place] as requested.

## 2022-09-23 ENCOUNTER — Encounter: Payer: Self-pay | Admitting: Family Medicine

## 2022-09-23 ENCOUNTER — Ambulatory Visit (INDEPENDENT_AMBULATORY_CARE_PROVIDER_SITE_OTHER): Payer: Self-pay | Admitting: Family Medicine

## 2022-09-23 VITALS — BP 137/85 | HR 111 | Temp 98.1°F | Resp 20 | Ht 70.0 in | Wt 164.0 lb

## 2022-09-23 DIAGNOSIS — A084 Viral intestinal infection, unspecified: Secondary | ICD-10-CM

## 2022-09-23 MED ORDER — ONDANSETRON 4 MG PO TBDP
4.0000 mg | ORAL_TABLET | Freq: Three times a day (TID) | ORAL | 0 refills | Status: DC | PRN
Start: 2022-09-23 — End: 2022-10-04

## 2022-09-23 MED ORDER — ONDANSETRON 4 MG PO TBDP
4.0000 mg | ORAL_TABLET | Freq: Once | ORAL | Status: DC
Start: 2022-09-23 — End: 2022-10-04

## 2022-09-23 NOTE — Progress Notes (Signed)
Subjective: [id number] PCP: Bennie Pierini, FNP [id number] Austin Gonzales is a 57 y.o. male presenting to clinic today for:  1. Nausea Reports several day history of nausea and nonbloody diarrhea.  He did have a couple episodes of nonbloody vomitus.  Over the last couple of days he has been tolerating fluids without difficulty and consuming quite a bit of Pedialyte never to keep himself hydrated.  He did experience some dizziness a couple of days ago.  He subsequently missed Wednesday and Thursday from work and needs a work note.  Has history of IBS at baseline.  Additionally, reports having a lapse in alprazolam due to financial constraints.  He is scheduled to go pick that up today.  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   ROS: Per HPI  Allergies  Allergen Reactions   Amoxicillin Diarrhea   Latex     blister   Trazodone     Nose bleeds.   Past Medical History:  Diagnosis Date   Anemia    Anxiety    GERD (gastroesophageal reflux disease)    Hyperlipidemia    Irritable bowel syndrome    Melanoma (HCC)    on lip    Current Outpatient Medications:    ALPRAZolam (XANAX) 0.5 MG tablet, Take 1 tablet (0.5 mg total) by mouth 2 (two) times daily as needed for anxiety., Disp: 60 tablet, Rfl: 0   fluticasone (FLONASE) 50 MCG/ACT nasal spray, Place 1 spray into both nostrils daily as needed for allergies or rhinitis., Disp: , Rfl:    loperamide (IMODIUM) 2 MG capsule, Take 2 mg by mouth 2 (two) times daily., Disp: , Rfl:    omeprazole (PRILOSEC) 40 MG capsule, Take 1 capsule (40 mg total) by mouth daily., Disp: 90 capsule, Rfl: 0   venlafaxine XR (EFFEXOR-XR) 37.5 MG 24 hr capsule, Take 37.5 mg by mouth at bedtime., Disp: , Rfl:    Eluxadoline (VIBERZI) 100 MG TABS, Take 1 tablet (100 mg total) by mouth 2 (two) times daily. (Patient not taking: Reported on 05/26/2022), Disp: 180 tablet, Rfl: 3   hydrocortisone (ANUSOL-HC) 25 MG suppository, Place 1 suppository (25 mg total)  rectally 2 (two) times daily. (Patient not taking: Reported on 09/23/2022), Disp: 28 suppository, Rfl: 0   ondansetron (ZOFRAN) 4 MG tablet, Take 1 tablet (4 mg total) by mouth every 8 (eight) hours as needed for nausea or vomiting. (Patient not taking: Reported on 09/23/2022), Disp: 30 tablet, Rfl: 3   ondansetron (ZOFRAN-ODT) 4 MG disintegrating tablet, Take 1 tablet (4 mg total) by mouth every 8 (eight) hours as needed for nausea or vomiting. (Patient not taking: Reported on 09/23/2022), Disp: 20 tablet, Rfl: 0 Social History   Socioeconomic History   Marital status: Divorced    Spouse name: Not on file   Number of children: 2   Years of education: Not on file   Highest education level: Not on file  Occupational History   Occupation: m   Occupation: Research scientist (physical sciences)  Tobacco Use   Smoking status: Every Day    Current packs/day: 0.50    Average packs/day: 0.5 packs/day for 15.0 years (7.5 ttl pk-yrs)    Types: Cigarettes   Smokeless tobacco: Never   Tobacco comments:    1 pack a day x 15 yrs  Vaping Use   Vaping status: Never Used  Substance and Sexual Activity   Alcohol use: Yes    Comment: drinks 12 on weekends   Drug use: No   Sexual  activity: Yes  Other Topics Concern   Not on file  Social History Narrative   Not on file   Social Determinants of Health   Financial Resource Strain: Not on file  Food Insecurity: Not on file  Transportation Needs: Not on file  Physical Activity: Not on file  Stress: Not on file  Social Connections: Not on file  Intimate Partner Violence: Not on file   Family History  Problem Relation Age of Onset   Cancer Mother        breast   Heart disease Mother    Hyperlipidemia Mother    Atrial fibrillation Mother    COPD Father    Asthma Father    Colon polyps Father    Irritable bowel syndrome Sister    Other Sister        vertigo   Colon cancer Neg Hx    Esophageal cancer Neg Hx     Objective: Office vital signs reviewed. BP  137/85   Pulse (!) 111   Temp 98.1 F (36.7 Austin) (Temporal)   Resp 20   Ht 5\' 10"  (1.778 m)   Wt 164 lb (74.4 kg)   SpO2 100%   BMI 23.53 kg/m   Physical Examination:  General: Awake, alert, nontoxic male, No acute distress HEENT: sclera white, MMM GI: soft, non-tender, non-distended, bowel sounds present x4, no hepatomegaly, no splenomegaly, no masses    Assessment/ Plan: 57 y.o. male   Viral gastroenteritis - Plan: ondansetron (ZOFRAN-ODT) disintegrating tablet 4 mg, ondansetron (ZOFRAN-ODT) 4 MG disintegrating tablet  Zofran administered here in office.  Rx sent to pharmacy.  Continue supportive care with hydration, Imodium if needed.  Follow-up as needed   Raliegh Ip, DO [facility] [phone]

## 2022-09-23 NOTE — Patient Instructions (Signed)
Food Choices to Help Relieve Diarrhea, Adult Diarrhea can make you feel weak and cause you to become dehydrated. Dehydration is a condition in which there is not enough water or other fluids in the body. It is important to choose the right foods and drinks to: Relieve diarrhea. Replace lost fluids and nutrients. Prevent dehydration. What are tips for following this plan? Relieving diarrhea Avoid foods that make your diarrhea worse. These may include: Foods and drinks that are sweetened with high-fructose corn syrup, honey, or sweeteners such as xylitol, sorbitol, and mannitol. Check food labels for these ingredients. Fried, greasy, or spicy foods. Raw fruits and vegetables. Eat foods that are rich in probiotics. These include foods such as yogurt and fermented milk products. Probiotics can help increase healthy bacteria in your stomach and intestines (gastrointestinal or GI tract). This may help digestion and stop diarrhea. If you have lactose intolerance, avoid dairy products. These may make your diarrhea worse. Take medicine to help stop diarrhea only as told by your health care provider. Replacing nutrients  Eat bland, easy-to-digest foods in small amounts as you are able, until your diarrhea starts to get better. These foods include bananas, applesauce, rice, toast, and crackers. Over time, add nutrient-rich foods as your body tolerates them or as told by your health care provider. These include: Well-cooked protein foods, such as eggs, lean meats like fish or chicken without skin, and tofu. Peeled, seeded, and soft-cooked fruits and vegetables. Low-fat dairy products. Whole grains. Take vitamin and mineral supplements as told by your health care provider. Preventing dehydration  Start by sipping water or a solution to prevent dehydration (oral rehydration solution, or ORS). This is a drink that helps replace fluids and minerals your body has lost. You can buy an ORS at pharmacies and  retail stores. Try to drink at least 8-10 cups (2,000-2,500 mL) of fluid each day to help replace lost fluids. If your urine is pale yellow, you are getting enough fluids. You may drink other liquids in addition to water, such as fruit juice that you have added water to (diluted fruit juice) or low-calorie sports drinks, as tolerated or as told by your health care provider. Avoid drinks with caffeine, such as coffee, tea, or soft drinks. Avoid alcohol. This information is not intended to replace advice given to you by your health care provider. Make sure you discuss any questions you have with your health care provider. Document Revised: 06/29/2021 Document Reviewed: 06/29/2021 Elsevier Patient Education  2024 [employer].

## 2022-10-04 ENCOUNTER — Encounter: Payer: Self-pay | Admitting: Family

## 2022-10-04 ENCOUNTER — Ambulatory Visit: Payer: Self-pay | Admitting: Family

## 2022-10-04 VITALS — BP 133/84 | HR 86 | Temp 98.3°F | Ht 70.0 in | Wt 168.8 lb

## 2022-10-04 DIAGNOSIS — A084 Viral intestinal infection, unspecified: Secondary | ICD-10-CM

## 2022-10-04 DIAGNOSIS — R55 Syncope and collapse: Secondary | ICD-10-CM

## 2022-10-04 DIAGNOSIS — Z09 Encounter for follow-up examination after completed treatment for conditions other than malignant neoplasm: Secondary | ICD-10-CM

## 2022-10-04 DIAGNOSIS — K580 Irritable bowel syndrome with diarrhea: Secondary | ICD-10-CM

## 2022-10-04 DIAGNOSIS — F411 Generalized anxiety disorder: Secondary | ICD-10-CM

## 2022-10-04 MED ORDER — ESCITALOPRAM OXALATE 10 MG PO TABS
10.0000 mg | ORAL_TABLET | Freq: Every day | ORAL | 3 refills | Status: AC
Start: 2022-10-04 — End: ?

## 2022-10-04 MED ORDER — ONDANSETRON 4 MG PO TBDP: 4.0000 mg | ORAL_TABLET | Freq: Three times a day (TID) | ORAL | 1 refills | Status: AC | PRN

## 2022-10-04 NOTE — Patient Instructions (Signed)
Syncope, Adult  Syncope refers to a condition in which a person temporarily loses consciousness. Syncope may also be called fainting or passing out. It is caused by a sudden decrease in blood flow to the brain. This can happen for a variety of reasons. Most causes of syncope are not dangerous. It can be triggered by things such as needle sticks, seeing blood, pain, or intense emotion. However, syncope can also be a sign of a serious medical problem, such as a heart abnormality. Other causes can include dehydration, migraines, or taking medicines that lower blood pressure. Your health care provider may do tests to find the reason why you are having syncope. If you faint, get medical help right away. Call your local emergency services (911 in the [place] Follow these instructions at home: Pay attention to any changes in your symptoms. Take these actions to stay safe and to help relieve your symptoms: Knowing when you may be about to faint Signs that you may be about to faint include: Feeling dizzy, weak, light-headed, or like the room is spinning. Feeling nauseous. Seeing spots or seeing all white or all black in your field of vision. Having cold, clammy skin or feeling warm and sweaty. Hearing ringing in the ears (tinnitus). If you start to feel like you might faint, sit or lie down right away. If sitting, put your head down between your legs. If lying down, raise (elevate) your feet above the level of your heart. Breathe deeply and steadily. Wait until all the symptoms have passed. Have someone stay with you until you feel stable. Medicines Take over-the-counter and prescription medicines only as told by your health care provider. If you are taking blood pressure or heart medicine, get up slowly and take several minutes to sit and then stand. This can reduce dizziness and decrease the risk of syncope. Lifestyle Do not drive, use machinery, or play sports until your health care provider says it is  okay. Do not drink alcohol. Do not use any products that contain nicotine or tobacco. These products include cigarettes, chewing tobacco, and vaping devices, such as e-cigarettes. If you need help quitting, ask your health care provider. Avoid hot tubs and saunas. General instructions Talk with your health care provider about your symptoms. You may need to have testing to understand the cause of your syncope. Drink enough fluid to keep your urine pale yellow. Avoid prolonged standing. If you must stand for a long time, do movements such as: Moving your legs. Crossing your legs. Flexing and stretching your leg muscles. Squatting. Keep all follow-up visits. This is important. Contact a health care provider if: You have episodes of near fainting. Get help right away if: You faint. You hit your head or are injured after fainting. You have any of these symptoms that may indicate trouble with your heart: Fast or irregular heartbeats (palpitations). Unusual pain in your chest, abdomen, or back. Shortness of breath. You have a seizure. You have a severe headache. You are confused. You have vision problems. You have severe weakness or trouble walking. You are bleeding from your mouth or rectum, or you have black or tarry stool. These symptoms may represent a serious problem that is an emergency. Do not wait to see if your symptoms will go away. Get medical help right away. Call your local emergency services (911 in the [place] Do not drive yourself to the hospital. Summary Syncope refers to a condition in which a person temporarily loses consciousness. Syncope may also be called   fainting or passing out. It is caused by a sudden decrease in blood flow to the brain. Signs that you may be about to faint include dizziness, feeling light-headed, feeling nauseous, sudden vision changes, or cold, clammy skin. Even though most causes of syncope are not dangerous, syncope can be a sign of a serious  medical problem. Get help right away if you faint. If you start to feel like you might faint, sit or lie down right away. If sitting, put your head down between your legs. If lying down, raise (elevate) your feet above the level of your heart. This information is not intended to replace advice given to you by your health care provider. Make sure you discuss any questions you have with your health care provider. Document Revised: 05/21/2020 Document Reviewed: 05/21/2020 Elsevier Patient Education  2024 [employer] Inc[employer]  

## 2022-10-04 NOTE — Progress Notes (Signed)
Subjective:    Patient ID: Austin Gonzales, male    DOB: 07/10/1965, 57 y.o.   MRN: [id number]  Chief Complaint  Patient presents with   Nausea    Since last Tuesday    Diarrhea   Anxiety    Ex wife dx with breast cancer    Seizures    2 went to ER small place on back of brain that was an old stroke. Stressing him out needs note for work    PT presents to the office today with multiple problems.   He reports he had a syncope episode on 08/29/22 and went to the ED. He had a CT scan that showed, "1. No acute finding. 2. Small chronic right cerebellar infarct.  And his MRI showed, "1. No acute intracranial abnormality or mass. No specific findings to explain seizures. 2. Old infarct in the right cerebellar hemisphere."  His lumbar spine showed, "1. No acute radiographic abnormality of the lumbar spine. 2. 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 However, he does not have insurance.   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 She was just diagnosed with Lung Ca with mets to her liver. He is very anxious about this.   He reports Friday evening he started having vomiting, diarrhea. He is improving, but very weak. After chart review, he has been diagnosed with viral gastroenteritis several times a year. He has seen a GI in the past and had a colonoscopy 11/06/20. He was diagnosed with IBS.   Diarrhea  This is a new problem. The current episode started in the past 7 days. The problem occurs 5 to 10 times per day. The problem has been gradually improving. The stool consistency is described as Watery. Associated symptoms include headaches and vomiting. Pertinent negatives include no fever or increased  flatus. He has tried increased fluids for the symptoms. The treatment provided mild relief.  Anxiety Presents for follow-up visit. Symptoms include excessive worry, nervous/anxious behavior and  restlessness. Symptoms occur occasionally. The severity of symptoms is mild.    Seizures  Associated symptoms include headaches, vomiting and diarrhea.      Review of Systems  Constitutional:  Negative for fever.  Gastrointestinal:  Positive for diarrhea and vomiting. Negative for flatus.  Neurological:  Positive for seizures and headaches.  Psychiatric/Behavioral:  The patient is nervous/anxious.   All other systems reviewed and are negative.      Objective:   Physical Exam Vitals reviewed.  Constitutional:      General: He is not in acute distress.    Appearance: He is well-developed.  HENT:     Head: Normocephalic.     Right Ear: Tympanic membrane normal.     Left Ear: Tympanic membrane normal.  Eyes:     General:        Right eye: No discharge.        Left eye: No discharge.     Pupils: Pupils are equal, round, and reactive to light.  Neck:     Thyroid: No thyromegaly.  Cardiovascular:     Rate and Rhythm: Normal rate and regular rhythm.     Heart sounds: Normal heart sounds. No murmur heard. Pulmonary:     Effort: Pulmonary effort is normal. No respiratory distress.     Breath sounds: Normal breath sounds. No wheezing.  Abdominal:     General: Bowel sounds are normal. There is no distension.  Palpations: Abdomen is soft.     Tenderness: There is no abdominal tenderness.  Musculoskeletal:        General: No tenderness. Normal range of motion.     Cervical back: Normal range of motion and neck supple.  Skin:    General: Skin is warm and dry.     Coloration: Skin is pale.     Findings: No erythema or rash.  Neurological:     Mental Status: He is alert and oriented to person, place, and time.     Cranial Nerves: No cranial nerve deficit.     Deep Tendon Reflexes: Reflexes are normal and symmetric.  Psychiatric:        Behavior: Behavior normal.        Thought Content: Thought content normal.        Judgment: Judgment normal.     BP 133/84   Pulse 86    Temp 98.3 F (36.8 C) (Temporal)   Ht 5\' 10"  (1.778 m)   Wt 168 lb 12.8 oz (76.6 kg)   SpO2 98%   BMI 24.22 kg/m      Assessment & Plan:  JDYN RESENDIZ comes in today with chief complaint of Nausea (Since last Tuesday ), Diarrhea, Anxiety (Ex wife dx with breast cancer ), and Seizures (2 went to ER small place on back of brain that was an old stroke. Stressing him out needs note for work )   Diagnosis and orders addressed:  1. Viral gastroenteritis Force fluids Zofran as needed Encouraged to follow up with GI, he is self pay and currently waiting to get insurance at his new job.  Bland diet  - ondansetron (ZOFRAN-ODT) 4 MG disintegrating tablet; Take 1 tablet (4 mg total) by mouth every 8 (eight) hours as needed for nausea or vomiting.  Dispense: 60 tablet; Refill: 1 - CBC with Differential/Platelet - CMP14+EGFR  2. Irritable bowel syndrome with diarrhea Bland diet  Continue Imodium  - CBC with Differential/Platelet - CMP14+EGFR  3. GAD (generalized anxiety disorder) Will start lexapro 10 mg Stress management  Needs follow up with PCP in 4 weeks  - escitalopram (LEXAPRO) 10 MG tablet; Take 1 tablet (10 mg total) by mouth daily.  Dispense: 90 tablet; Refill: 3 - CBC with Differential/Platelet - CMP14+EGFR  4. Syncope, unspecified syncope type Needs follow up with Neurologists Reports he is not drinking alcohol any longer - CBC with Differential/Platelet - CMP14+EGFR  5. Hospital discharge follow-up - CBC with Differential/Platelet - CMP14+EGFR   Labs pending Health Maintenance reviewed Diet and exercise encouraged  Follow up plan: 1 month with PCP   Jannifer Rodney, FNP

## 2022-10-05 LAB — CBC WITH DIFFERENTIAL/PLATELET
Basophils Absolute: 0 10*3/uL (ref 0.0–0.2)
Basos: 0 %
EOS (ABSOLUTE): 0 10*3/uL (ref 0.0–0.4)
Eos: 1 %
Hematocrit: 40.4 % (ref 37.5–51.0)
Hemoglobin: 13.6 g/dL (ref 13.0–17.7)
Immature Grans (Abs): 0 10*3/uL (ref 0.0–0.1)
Immature Granulocytes: 0 %
Lymphocytes Absolute: 1.5 10*3/uL (ref 0.7–3.1)
Lymphs: 21 %
MCH: 34.5 pg — ABNORMAL HIGH (ref 26.6–33.0)
MCHC: 33.7 g/dL (ref 31.5–35.7)
MCV: 103 fL — ABNORMAL HIGH (ref 79–97)
Monocytes Absolute: 1 10*3/uL — ABNORMAL HIGH (ref 0.1–0.9)
Monocytes: 13 %
Neutrophils Absolute: 4.6 10*3/uL (ref 1.4–7.0)
Neutrophils: 65 %
Platelets: 338 10*3/uL (ref 150–450)
RBC: 3.94 x10E6/uL — ABNORMAL LOW (ref 4.14–5.80)
RDW: 12.6 % (ref 11.6–15.4)
WBC: 7.2 10*3/uL (ref 3.4–10.8)

## 2022-10-05 LAB — CMP14+EGFR
ALT: 11 IU/L (ref 0–44)
AST: 19 IU/L (ref 0–40)
Albumin: 4.4 g/dL (ref 3.8–4.9)
Alkaline Phosphatase: 72 IU/L (ref 44–121)
BUN/Creatinine Ratio: 5 — ABNORMAL LOW (ref 9–20)
BUN: 5 mg/dL — ABNORMAL LOW (ref 6–24)
Bilirubin Total: 0.5 mg/dL (ref 0.0–1.2)
CO2: 24 mmol/L (ref 20–29)
Calcium: 9.2 mg/dL (ref 8.7–10.2)
Chloride: 100 mmol/L (ref 96–106)
Creatinine, Ser: 0.95 mg/dL (ref 0.76–1.27)
Globulin, Total: 2.7 g/dL (ref 1.5–4.5)
Glucose: 65 mg/dL — ABNORMAL LOW (ref 70–99)
Potassium: 3.9 mmol/L (ref 3.5–5.2)
Sodium: 139 mmol/L (ref 134–144)
Total Protein: 7.1 g/dL (ref 6.0–8.5)
eGFR: 93 mL/min/{1.73_m2} (ref >59)

## 2022-11-03 ENCOUNTER — Other Ambulatory Visit: Payer: Self-pay | Admitting: Family Medicine

## 2022-11-03 DIAGNOSIS — K219 Gastro-esophageal reflux disease without esophagitis: Secondary | ICD-10-CM

## 2022-11-03 DIAGNOSIS — R112 Nausea with vomiting, unspecified: Secondary | ICD-10-CM

## 2022-11-03 DIAGNOSIS — R197 Diarrhea, unspecified: Secondary | ICD-10-CM

## 2023-04-08 DIAGNOSIS — S45002A Unspecified injury of axillary artery, left side, initial encounter: Secondary | ICD-10-CM | POA: Diagnosis not present

## 2023-04-08 DIAGNOSIS — S21301A Unspecified open wound of right front wall of thorax with penetration into thoracic cavity, initial encounter: Secondary | ICD-10-CM | POA: Diagnosis not present

## 2023-04-08 DIAGNOSIS — S21102A Unspecified open wound of left front wall of thorax without penetration into thoracic cavity, initial encounter: Secondary | ICD-10-CM | POA: Diagnosis not present

## 2023-04-08 DIAGNOSIS — Z049 Encounter for examination and observation for unspecified reason: Secondary | ICD-10-CM | POA: Diagnosis not present

## 2023-04-08 DIAGNOSIS — S42112A Displaced fracture of body of scapula, left shoulder, initial encounter for closed fracture: Secondary | ICD-10-CM | POA: Diagnosis not present

## 2023-04-08 DIAGNOSIS — Y249XXA Unspecified firearm discharge, undetermined intent, initial encounter: Secondary | ICD-10-CM | POA: Diagnosis not present

## 2023-04-09 DIAGNOSIS — S42112A Displaced fracture of body of scapula, left shoulder, initial encounter for closed fracture: Secondary | ICD-10-CM | POA: Diagnosis not present

## 2023-04-09 DIAGNOSIS — S299XXA Unspecified injury of thorax, initial encounter: Secondary | ICD-10-CM | POA: Diagnosis not present

## 2023-04-09 DIAGNOSIS — M25522 Pain in left elbow: Secondary | ICD-10-CM | POA: Diagnosis not present

## 2023-04-09 DIAGNOSIS — M79632 Pain in left forearm: Secondary | ICD-10-CM | POA: Diagnosis not present

## 2023-04-09 DIAGNOSIS — S42102A Fracture of unspecified part of scapula, left shoulder, initial encounter for closed fracture: Secondary | ICD-10-CM | POA: Diagnosis not present

## 2023-04-09 DIAGNOSIS — S45002A Unspecified injury of axillary artery, left side, initial encounter: Secondary | ICD-10-CM | POA: Diagnosis not present

## 2023-04-09 DIAGNOSIS — S42113A Displaced fracture of body of scapula, unspecified shoulder, initial encounter for closed fracture: Secondary | ICD-10-CM | POA: Diagnosis not present

## 2023-04-09 DIAGNOSIS — S45091A Other specified injury of axillary artery, right side, initial encounter: Secondary | ICD-10-CM | POA: Diagnosis not present

## 2023-04-09 DIAGNOSIS — I721 Aneurysm of artery of upper extremity: Secondary | ICD-10-CM | POA: Diagnosis not present

## 2023-04-09 DIAGNOSIS — W3400XA Accidental discharge from unspecified firearms or gun, initial encounter: Secondary | ICD-10-CM | POA: Diagnosis not present

## 2023-04-09 DIAGNOSIS — S45009A Unspecified injury of axillary artery, unspecified side, initial encounter: Secondary | ICD-10-CM | POA: Diagnosis not present

## 2023-04-10 DIAGNOSIS — S52042D Displaced fracture of coronoid process of left ulna, subsequent encounter for closed fracture with routine healing: Secondary | ICD-10-CM | POA: Diagnosis not present

## 2023-04-11 DIAGNOSIS — K219 Gastro-esophageal reflux disease without esophagitis: Secondary | ICD-10-CM | POA: Diagnosis not present

## 2023-04-11 DIAGNOSIS — D62 Acute posthemorrhagic anemia: Secondary | ICD-10-CM | POA: Diagnosis not present

## 2023-04-11 DIAGNOSIS — S42112A Displaced fracture of body of scapula, left shoulder, initial encounter for closed fracture: Secondary | ICD-10-CM | POA: Diagnosis not present

## 2023-04-11 DIAGNOSIS — F418 Other specified anxiety disorders: Secondary | ICD-10-CM | POA: Diagnosis not present

## 2023-04-11 DIAGNOSIS — S45002A Unspecified injury of axillary artery, left side, initial encounter: Secondary | ICD-10-CM | POA: Diagnosis not present

## 2023-04-11 DIAGNOSIS — M800AXA Age-related osteoporosis with current pathological fracture, other site, initial encounter for fracture: Secondary | ICD-10-CM | POA: Diagnosis not present

## 2023-04-11 DIAGNOSIS — R292 Abnormal reflex: Secondary | ICD-10-CM | POA: Diagnosis not present

## 2023-04-11 DIAGNOSIS — J984 Other disorders of lung: Secondary | ICD-10-CM | POA: Diagnosis not present

## 2023-04-11 DIAGNOSIS — K589 Irritable bowel syndrome without diarrhea: Secondary | ICD-10-CM | POA: Diagnosis not present

## 2023-04-12 DIAGNOSIS — K589 Irritable bowel syndrome without diarrhea: Secondary | ICD-10-CM | POA: Diagnosis not present

## 2023-04-12 DIAGNOSIS — M800AXA Age-related osteoporosis with current pathological fracture, other site, initial encounter for fracture: Secondary | ICD-10-CM | POA: Diagnosis not present

## 2023-04-12 DIAGNOSIS — K219 Gastro-esophageal reflux disease without esophagitis: Secondary | ICD-10-CM | POA: Diagnosis not present

## 2023-04-12 DIAGNOSIS — R292 Abnormal reflex: Secondary | ICD-10-CM | POA: Diagnosis not present

## 2023-04-12 DIAGNOSIS — S42112A Displaced fracture of body of scapula, left shoulder, initial encounter for closed fracture: Secondary | ICD-10-CM | POA: Diagnosis not present

## 2023-04-12 DIAGNOSIS — J984 Other disorders of lung: Secondary | ICD-10-CM | POA: Diagnosis not present

## 2023-04-12 DIAGNOSIS — F418 Other specified anxiety disorders: Secondary | ICD-10-CM | POA: Diagnosis not present

## 2023-04-12 DIAGNOSIS — S45002A Unspecified injury of axillary artery, left side, initial encounter: Secondary | ICD-10-CM | POA: Diagnosis not present

## 2023-04-12 DIAGNOSIS — R000 Tachycardia, unspecified: Secondary | ICD-10-CM | POA: Diagnosis not present

## 2023-04-12 DIAGNOSIS — D62 Acute posthemorrhagic anemia: Secondary | ICD-10-CM | POA: Diagnosis not present

## 2023-04-13 DIAGNOSIS — R0602 Shortness of breath: Secondary | ICD-10-CM | POA: Diagnosis not present

## 2023-04-14 DIAGNOSIS — Z4659 Encounter for fitting and adjustment of other gastrointestinal appliance and device: Secondary | ICD-10-CM | POA: Diagnosis not present

## 2023-04-16 DIAGNOSIS — S45002A Unspecified injury of axillary artery, left side, initial encounter: Secondary | ICD-10-CM | POA: Diagnosis not present

## 2023-04-16 DIAGNOSIS — J984 Other disorders of lung: Secondary | ICD-10-CM | POA: Diagnosis not present

## 2023-04-16 DIAGNOSIS — D62 Acute posthemorrhagic anemia: Secondary | ICD-10-CM | POA: Diagnosis not present

## 2023-04-16 DIAGNOSIS — K219 Gastro-esophageal reflux disease without esophagitis: Secondary | ICD-10-CM | POA: Diagnosis not present

## 2023-04-16 DIAGNOSIS — F418 Other specified anxiety disorders: Secondary | ICD-10-CM | POA: Diagnosis not present

## 2023-04-16 DIAGNOSIS — K589 Irritable bowel syndrome without diarrhea: Secondary | ICD-10-CM | POA: Diagnosis not present

## 2023-04-16 DIAGNOSIS — S42112A Displaced fracture of body of scapula, left shoulder, initial encounter for closed fracture: Secondary | ICD-10-CM | POA: Diagnosis not present

## 2023-04-16 DIAGNOSIS — M800AXA Age-related osteoporosis with current pathological fracture, other site, initial encounter for fracture: Secondary | ICD-10-CM | POA: Diagnosis not present

## 2023-04-18 DIAGNOSIS — S42112A Displaced fracture of body of scapula, left shoulder, initial encounter for closed fracture: Secondary | ICD-10-CM | POA: Diagnosis not present

## 2023-04-18 DIAGNOSIS — F418 Other specified anxiety disorders: Secondary | ICD-10-CM | POA: Diagnosis not present

## 2023-04-18 DIAGNOSIS — J984 Other disorders of lung: Secondary | ICD-10-CM | POA: Diagnosis not present

## 2023-04-18 DIAGNOSIS — S45002A Unspecified injury of axillary artery, left side, initial encounter: Secondary | ICD-10-CM | POA: Diagnosis not present

## 2023-04-18 DIAGNOSIS — R292 Abnormal reflex: Secondary | ICD-10-CM | POA: Diagnosis not present

## 2023-04-18 DIAGNOSIS — F1090 Alcohol use, unspecified, uncomplicated: Secondary | ICD-10-CM | POA: Diagnosis not present

## 2023-04-18 DIAGNOSIS — K589 Irritable bowel syndrome without diarrhea: Secondary | ICD-10-CM | POA: Diagnosis not present

## 2023-04-18 DIAGNOSIS — M800AXA Age-related osteoporosis with current pathological fracture, other site, initial encounter for fracture: Secondary | ICD-10-CM | POA: Diagnosis not present

## 2023-04-19 DIAGNOSIS — S45092A Other specified injury of axillary artery, left side, initial encounter: Secondary | ICD-10-CM | POA: Diagnosis not present

## 2023-04-19 DIAGNOSIS — J984 Other disorders of lung: Secondary | ICD-10-CM | POA: Diagnosis not present

## 2023-04-19 DIAGNOSIS — K219 Gastro-esophageal reflux disease without esophagitis: Secondary | ICD-10-CM | POA: Diagnosis not present

## 2023-04-19 DIAGNOSIS — R278 Other lack of coordination: Secondary | ICD-10-CM | POA: Diagnosis not present

## 2023-04-19 DIAGNOSIS — S42112A Displaced fracture of body of scapula, left shoulder, initial encounter for closed fracture: Secondary | ICD-10-CM | POA: Diagnosis not present

## 2023-04-19 DIAGNOSIS — F1011 Alcohol abuse, in remission: Secondary | ICD-10-CM | POA: Diagnosis not present

## 2023-04-19 DIAGNOSIS — K589 Irritable bowel syndrome without diarrhea: Secondary | ICD-10-CM | POA: Diagnosis not present

## 2023-04-19 DIAGNOSIS — M800AXA Age-related osteoporosis with current pathological fracture, other site, initial encounter for fracture: Secondary | ICD-10-CM | POA: Diagnosis not present

## 2023-04-19 DIAGNOSIS — S20212A Contusion of left front wall of thorax, initial encounter: Secondary | ICD-10-CM | POA: Diagnosis not present

## 2023-04-19 DIAGNOSIS — F418 Other specified anxiety disorders: Secondary | ICD-10-CM | POA: Diagnosis not present

## 2023-04-20 DIAGNOSIS — K219 Gastro-esophageal reflux disease without esophagitis: Secondary | ICD-10-CM | POA: Diagnosis not present

## 2023-04-20 DIAGNOSIS — S20212A Contusion of left front wall of thorax, initial encounter: Secondary | ICD-10-CM | POA: Diagnosis not present

## 2023-04-20 DIAGNOSIS — F1011 Alcohol abuse, in remission: Secondary | ICD-10-CM | POA: Diagnosis not present

## 2023-04-20 DIAGNOSIS — R278 Other lack of coordination: Secondary | ICD-10-CM | POA: Diagnosis not present

## 2023-04-20 DIAGNOSIS — K589 Irritable bowel syndrome without diarrhea: Secondary | ICD-10-CM | POA: Diagnosis not present

## 2023-04-20 DIAGNOSIS — J984 Other disorders of lung: Secondary | ICD-10-CM | POA: Diagnosis not present

## 2023-04-20 DIAGNOSIS — S45092A Other specified injury of axillary artery, left side, initial encounter: Secondary | ICD-10-CM | POA: Diagnosis not present

## 2023-04-20 DIAGNOSIS — F418 Other specified anxiety disorders: Secondary | ICD-10-CM | POA: Diagnosis not present

## 2023-04-20 DIAGNOSIS — S42112A Displaced fracture of body of scapula, left shoulder, initial encounter for closed fracture: Secondary | ICD-10-CM | POA: Diagnosis not present

## 2023-04-20 DIAGNOSIS — M800AXA Age-related osteoporosis with current pathological fracture, other site, initial encounter for fracture: Secondary | ICD-10-CM | POA: Diagnosis not present

## 2023-04-21 DIAGNOSIS — S42112A Displaced fracture of body of scapula, left shoulder, initial encounter for closed fracture: Secondary | ICD-10-CM | POA: Diagnosis not present

## 2023-04-21 DIAGNOSIS — K589 Irritable bowel syndrome without diarrhea: Secondary | ICD-10-CM | POA: Diagnosis not present

## 2023-04-21 DIAGNOSIS — S45092A Other specified injury of axillary artery, left side, initial encounter: Secondary | ICD-10-CM | POA: Diagnosis not present

## 2023-04-21 DIAGNOSIS — M800AXA Age-related osteoporosis with current pathological fracture, other site, initial encounter for fracture: Secondary | ICD-10-CM | POA: Diagnosis not present

## 2023-04-21 DIAGNOSIS — F418 Other specified anxiety disorders: Secondary | ICD-10-CM | POA: Diagnosis not present

## 2023-04-21 DIAGNOSIS — K219 Gastro-esophageal reflux disease without esophagitis: Secondary | ICD-10-CM | POA: Diagnosis not present

## 2023-04-21 DIAGNOSIS — J984 Other disorders of lung: Secondary | ICD-10-CM | POA: Diagnosis not present

## 2023-04-21 DIAGNOSIS — S20212A Contusion of left front wall of thorax, initial encounter: Secondary | ICD-10-CM | POA: Diagnosis not present

## 2023-04-21 DIAGNOSIS — R278 Other lack of coordination: Secondary | ICD-10-CM | POA: Diagnosis not present

## 2023-04-22 DIAGNOSIS — S42132A Displaced fracture of coracoid process, left shoulder, initial encounter for closed fracture: Secondary | ICD-10-CM | POA: Diagnosis not present

## 2023-04-22 DIAGNOSIS — L03818 Cellulitis of other sites: Secondary | ICD-10-CM | POA: Diagnosis not present

## 2023-04-22 DIAGNOSIS — S42102A Fracture of unspecified part of scapula, left shoulder, initial encounter for closed fracture: Secondary | ICD-10-CM | POA: Diagnosis not present

## 2023-04-22 DIAGNOSIS — I498 Other specified cardiac arrhythmias: Secondary | ICD-10-CM | POA: Diagnosis not present

## 2023-04-22 DIAGNOSIS — S45092A Other specified injury of axillary artery, left side, initial encounter: Secondary | ICD-10-CM | POA: Diagnosis not present

## 2023-04-22 DIAGNOSIS — S25192A Other specified injury of left innominate or subclavian artery, initial encounter: Secondary | ICD-10-CM | POA: Diagnosis not present

## 2023-04-22 DIAGNOSIS — S45002A Unspecified injury of axillary artery, left side, initial encounter: Secondary | ICD-10-CM | POA: Diagnosis not present

## 2023-04-28 DIAGNOSIS — S143XXA Injury of brachial plexus, initial encounter: Secondary | ICD-10-CM | POA: Diagnosis not present

## 2023-04-28 DIAGNOSIS — W3400XA Accidental discharge from unspecified firearms or gun, initial encounter: Secondary | ICD-10-CM | POA: Diagnosis not present

## 2023-05-09 DIAGNOSIS — Z95820 Peripheral vascular angioplasty status with implants and grafts: Secondary | ICD-10-CM | POA: Diagnosis not present

## 2023-05-09 DIAGNOSIS — S42112D Displaced fracture of body of scapula, left shoulder, subsequent encounter for fracture with routine healing: Secondary | ICD-10-CM | POA: Diagnosis not present

## 2023-05-09 DIAGNOSIS — Z720 Tobacco use: Secondary | ICD-10-CM | POA: Diagnosis not present

## 2023-05-09 DIAGNOSIS — M898X1 Other specified disorders of bone, shoulder: Secondary | ICD-10-CM | POA: Diagnosis not present

## 2023-05-09 DIAGNOSIS — S45002A Unspecified injury of axillary artery, left side, initial encounter: Secondary | ICD-10-CM | POA: Diagnosis not present

## 2023-05-16 DIAGNOSIS — S45002D Unspecified injury of axillary artery, left side, subsequent encounter: Secondary | ICD-10-CM | POA: Diagnosis not present

## 2023-05-16 DIAGNOSIS — S143XXD Injury of brachial plexus, subsequent encounter: Secondary | ICD-10-CM | POA: Diagnosis not present

## 2023-05-23 DIAGNOSIS — Z5189 Encounter for other specified aftercare: Secondary | ICD-10-CM | POA: Diagnosis not present

## 2023-05-25 DIAGNOSIS — F331 Major depressive disorder, recurrent, moderate: Secondary | ICD-10-CM | POA: Diagnosis not present

## 2023-05-25 DIAGNOSIS — F411 Generalized anxiety disorder: Secondary | ICD-10-CM | POA: Diagnosis not present

## 2023-06-09 DIAGNOSIS — W3400XA Accidental discharge from unspecified firearms or gun, initial encounter: Secondary | ICD-10-CM | POA: Diagnosis not present

## 2023-06-09 DIAGNOSIS — S143XXD Injury of brachial plexus, subsequent encounter: Secondary | ICD-10-CM | POA: Diagnosis not present

## 2023-06-12 DIAGNOSIS — G629 Polyneuropathy, unspecified: Secondary | ICD-10-CM | POA: Diagnosis not present

## 2023-06-12 DIAGNOSIS — G5632 Lesion of radial nerve, left upper limb: Secondary | ICD-10-CM | POA: Diagnosis not present

## 2023-06-12 DIAGNOSIS — G5612 Other lesions of median nerve, left upper limb: Secondary | ICD-10-CM | POA: Diagnosis not present

## 2023-06-12 DIAGNOSIS — G5682 Other specified mononeuropathies of left upper limb: Secondary | ICD-10-CM | POA: Diagnosis not present

## 2023-06-12 DIAGNOSIS — S143XXD Injury of brachial plexus, subsequent encounter: Secondary | ICD-10-CM | POA: Diagnosis not present

## 2023-07-04 DIAGNOSIS — M898X1 Other specified disorders of bone, shoulder: Secondary | ICD-10-CM | POA: Diagnosis not present

## 2023-07-04 DIAGNOSIS — S42112D Displaced fracture of body of scapula, left shoulder, subsequent encounter for fracture with routine healing: Secondary | ICD-10-CM | POA: Diagnosis not present

## 2023-07-27 DIAGNOSIS — M79642 Pain in left hand: Secondary | ICD-10-CM | POA: Diagnosis not present

## 2023-07-27 DIAGNOSIS — M25512 Pain in left shoulder: Secondary | ICD-10-CM | POA: Diagnosis not present

## 2023-07-27 DIAGNOSIS — M25522 Pain in left elbow: Secondary | ICD-10-CM | POA: Diagnosis not present

## 2023-08-11 DIAGNOSIS — M79642 Pain in left hand: Secondary | ICD-10-CM | POA: Diagnosis not present

## 2023-08-11 DIAGNOSIS — M25512 Pain in left shoulder: Secondary | ICD-10-CM | POA: Diagnosis not present

## 2023-08-11 DIAGNOSIS — M25522 Pain in left elbow: Secondary | ICD-10-CM | POA: Diagnosis not present

## 2023-08-21 DIAGNOSIS — F331 Major depressive disorder, recurrent, moderate: Secondary | ICD-10-CM | POA: Diagnosis not present

## 2023-08-21 DIAGNOSIS — F411 Generalized anxiety disorder: Secondary | ICD-10-CM | POA: Diagnosis not present

## 2023-09-08 DIAGNOSIS — S143XXD Injury of brachial plexus, subsequent encounter: Secondary | ICD-10-CM | POA: Diagnosis not present

## 2023-09-08 DIAGNOSIS — W3400XA Accidental discharge from unspecified firearms or gun, initial encounter: Secondary | ICD-10-CM | POA: Diagnosis not present

## 2023-10-11 DIAGNOSIS — M25571 Pain in right ankle and joints of right foot: Secondary | ICD-10-CM | POA: Diagnosis not present

## 2023-10-11 DIAGNOSIS — M25462 Effusion, left knee: Secondary | ICD-10-CM | POA: Diagnosis not present

## 2023-10-11 DIAGNOSIS — D649 Anemia, unspecified: Secondary | ICD-10-CM | POA: Diagnosis not present

## 2023-10-11 DIAGNOSIS — M25471 Effusion, right ankle: Secondary | ICD-10-CM | POA: Diagnosis not present

## 2023-10-11 DIAGNOSIS — M25562 Pain in left knee: Secondary | ICD-10-CM | POA: Diagnosis not present

## 2023-10-16 DIAGNOSIS — M1712 Unilateral primary osteoarthritis, left knee: Secondary | ICD-10-CM | POA: Diagnosis not present

## 2023-10-16 DIAGNOSIS — M25571 Pain in right ankle and joints of right foot: Secondary | ICD-10-CM | POA: Diagnosis not present

## 2023-10-16 DIAGNOSIS — M25562 Pain in left knee: Secondary | ICD-10-CM | POA: Diagnosis not present

## 2023-10-16 DIAGNOSIS — M19071 Primary osteoarthritis, right ankle and foot: Secondary | ICD-10-CM | POA: Diagnosis not present

## 2023-10-19 DIAGNOSIS — M25522 Pain in left elbow: Secondary | ICD-10-CM | POA: Diagnosis not present

## 2023-10-19 DIAGNOSIS — M25512 Pain in left shoulder: Secondary | ICD-10-CM | POA: Diagnosis not present

## 2023-10-19 DIAGNOSIS — M79642 Pain in left hand: Secondary | ICD-10-CM | POA: Diagnosis not present

## 2023-10-20 DIAGNOSIS — F331 Major depressive disorder, recurrent, moderate: Secondary | ICD-10-CM | POA: Diagnosis not present

## 2023-10-20 DIAGNOSIS — F411 Generalized anxiety disorder: Secondary | ICD-10-CM | POA: Diagnosis not present

## 2023-11-01 DIAGNOSIS — H269 Unspecified cataract: Secondary | ICD-10-CM | POA: Diagnosis not present

## 2023-11-01 DIAGNOSIS — D519 Vitamin B12 deficiency anemia, unspecified: Secondary | ICD-10-CM | POA: Diagnosis not present

## 2023-11-01 DIAGNOSIS — L989 Disorder of the skin and subcutaneous tissue, unspecified: Secondary | ICD-10-CM | POA: Diagnosis not present

## 2023-11-01 DIAGNOSIS — A630 Anogenital (venereal) warts: Secondary | ICD-10-CM | POA: Diagnosis not present

## 2023-11-01 DIAGNOSIS — B079 Viral wart, unspecified: Secondary | ICD-10-CM | POA: Diagnosis not present

## 2023-11-01 DIAGNOSIS — L819 Disorder of pigmentation, unspecified: Secondary | ICD-10-CM | POA: Diagnosis not present

## 2023-11-01 DIAGNOSIS — N509 Disorder of male genital organs, unspecified: Secondary | ICD-10-CM | POA: Diagnosis not present

## 2023-11-08 ENCOUNTER — Encounter (INDEPENDENT_AMBULATORY_CARE_PROVIDER_SITE_OTHER): Payer: Self-pay | Admitting: Gastroenterology

## 2023-11-13 DIAGNOSIS — M79642 Pain in left hand: Secondary | ICD-10-CM | POA: Diagnosis not present

## 2023-11-13 DIAGNOSIS — M25522 Pain in left elbow: Secondary | ICD-10-CM | POA: Diagnosis not present

## 2023-11-13 DIAGNOSIS — M25512 Pain in left shoulder: Secondary | ICD-10-CM | POA: Diagnosis not present

## 2023-11-22 DIAGNOSIS — Z114 Encounter for screening for human immunodeficiency virus [HIV]: Secondary | ICD-10-CM | POA: Diagnosis not present

## 2023-11-22 DIAGNOSIS — E538 Deficiency of other specified B group vitamins: Secondary | ICD-10-CM | POA: Diagnosis not present

## 2023-11-24 DIAGNOSIS — M79642 Pain in left hand: Secondary | ICD-10-CM | POA: Diagnosis not present

## 2023-11-24 DIAGNOSIS — M25512 Pain in left shoulder: Secondary | ICD-10-CM | POA: Diagnosis not present

## 2023-11-24 DIAGNOSIS — M25522 Pain in left elbow: Secondary | ICD-10-CM | POA: Diagnosis not present

## 2023-12-08 DIAGNOSIS — M79642 Pain in left hand: Secondary | ICD-10-CM | POA: Diagnosis not present

## 2023-12-08 DIAGNOSIS — M25522 Pain in left elbow: Secondary | ICD-10-CM | POA: Diagnosis not present

## 2023-12-08 DIAGNOSIS — M25512 Pain in left shoulder: Secondary | ICD-10-CM | POA: Diagnosis not present

## 2023-12-15 DIAGNOSIS — D519 Vitamin B12 deficiency anemia, unspecified: Secondary | ICD-10-CM | POA: Diagnosis not present

## 2023-12-18 DIAGNOSIS — M25512 Pain in left shoulder: Secondary | ICD-10-CM | POA: Diagnosis not present

## 2023-12-18 DIAGNOSIS — M79642 Pain in left hand: Secondary | ICD-10-CM | POA: Diagnosis not present

## 2023-12-18 DIAGNOSIS — M25522 Pain in left elbow: Secondary | ICD-10-CM | POA: Diagnosis not present

## 2023-12-19 DIAGNOSIS — F411 Generalized anxiety disorder: Secondary | ICD-10-CM | POA: Diagnosis not present

## 2023-12-19 DIAGNOSIS — F331 Major depressive disorder, recurrent, moderate: Secondary | ICD-10-CM | POA: Diagnosis not present

## 2023-12-25 DIAGNOSIS — M79642 Pain in left hand: Secondary | ICD-10-CM | POA: Diagnosis not present

## 2023-12-25 DIAGNOSIS — M25512 Pain in left shoulder: Secondary | ICD-10-CM | POA: Diagnosis not present

## 2023-12-25 DIAGNOSIS — M25522 Pain in left elbow: Secondary | ICD-10-CM | POA: Diagnosis not present

## 2024-01-05 DIAGNOSIS — M25561 Pain in right knee: Secondary | ICD-10-CM | POA: Diagnosis not present

## 2024-01-05 DIAGNOSIS — R2681 Unsteadiness on feet: Secondary | ICD-10-CM | POA: Diagnosis not present
